# Patient Record
Sex: Female | Born: 1960 | Race: White | Hispanic: No | Smoking: Never smoker
Health system: Southern US, Community
[De-identification: ages and names within clinical notes are randomized; demographics above are authoritative.]

## PROBLEM LIST (undated history)

## (undated) DIAGNOSIS — L930 Discoid lupus erythematosus: Secondary | ICD-10-CM

## (undated) DIAGNOSIS — M8580 Other specified disorders of bone density and structure, unspecified site: Secondary | ICD-10-CM

## (undated) DIAGNOSIS — N200 Calculus of kidney: Secondary | ICD-10-CM

## (undated) DIAGNOSIS — M3214 Glomerular disease in systemic lupus erythematosus: Secondary | ICD-10-CM

## (undated) DIAGNOSIS — E785 Hyperlipidemia, unspecified: Secondary | ICD-10-CM

## (undated) HISTORY — DX: Hyperlipidemia, unspecified: E78.5

## (undated) HISTORY — DX: Discoid lupus erythematosus: L93.0

## (undated) HISTORY — PX: KNEE SURGERY: SHX244

## (undated) HISTORY — DX: Calculus of kidney: N20.0

## (undated) HISTORY — PX: LITHOTRIPSY: SUR834

## (undated) HISTORY — DX: Glomerular disease in systemic lupus erythematosus: M32.14

## (undated) HISTORY — DX: Other specified disorders of bone density and structure, unspecified site: M85.80

---

## 1999-03-21 ENCOUNTER — Other Ambulatory Visit: Admission: RE | Admit: 1999-03-21 | Discharge: 1999-03-21 | Payer: Self-pay | Admitting: Obstetrics and Gynecology

## 2000-04-19 ENCOUNTER — Other Ambulatory Visit: Admission: RE | Admit: 2000-04-19 | Discharge: 2000-04-19 | Payer: Self-pay | Admitting: Obstetrics and Gynecology

## 2001-05-16 ENCOUNTER — Other Ambulatory Visit: Admission: RE | Admit: 2001-05-16 | Discharge: 2001-05-16 | Payer: Self-pay | Admitting: Obstetrics and Gynecology

## 2002-06-05 ENCOUNTER — Other Ambulatory Visit: Admission: RE | Admit: 2002-06-05 | Discharge: 2002-06-05 | Payer: Self-pay | Admitting: Obstetrics and Gynecology

## 2002-11-06 ENCOUNTER — Encounter: Payer: Self-pay | Admitting: Urology

## 2002-11-06 ENCOUNTER — Encounter: Admission: RE | Admit: 2002-11-06 | Discharge: 2002-11-06 | Payer: Self-pay | Admitting: Urology

## 2003-07-13 ENCOUNTER — Other Ambulatory Visit: Admission: RE | Admit: 2003-07-13 | Discharge: 2003-07-13 | Payer: Self-pay | Admitting: Obstetrics and Gynecology

## 2004-05-20 ENCOUNTER — Encounter: Admission: RE | Admit: 2004-05-20 | Discharge: 2004-05-20 | Payer: Self-pay | Admitting: Nephrology

## 2004-07-15 ENCOUNTER — Other Ambulatory Visit: Admission: RE | Admit: 2004-07-15 | Discharge: 2004-07-15 | Payer: Self-pay | Admitting: Obstetrics and Gynecology

## 2005-08-25 ENCOUNTER — Other Ambulatory Visit: Admission: RE | Admit: 2005-08-25 | Discharge: 2005-08-25 | Payer: Self-pay | Admitting: Obstetrics and Gynecology

## 2007-02-21 ENCOUNTER — Ambulatory Visit (HOSPITAL_COMMUNITY): Admission: RE | Admit: 2007-02-21 | Discharge: 2007-02-21 | Payer: Self-pay | Admitting: Urology

## 2007-04-05 ENCOUNTER — Ambulatory Visit: Payer: Self-pay | Admitting: Family Medicine

## 2007-04-05 ENCOUNTER — Telehealth (INDEPENDENT_AMBULATORY_CARE_PROVIDER_SITE_OTHER): Payer: Self-pay | Admitting: *Deleted

## 2007-04-05 DIAGNOSIS — Z87442 Personal history of urinary calculi: Secondary | ICD-10-CM

## 2007-04-05 DIAGNOSIS — L930 Discoid lupus erythematosus: Secondary | ICD-10-CM

## 2007-04-05 DIAGNOSIS — K644 Residual hemorrhoidal skin tags: Secondary | ICD-10-CM | POA: Insufficient documentation

## 2007-04-05 LAB — CONVERTED CEMR LAB
Basophils Absolute: 0 10*3/uL (ref 0.0–0.1)
Basophils Relative: 0.3 % (ref 0.0–1.0)
Cholesterol: 190 mg/dL (ref 0–200)
Eosinophils Absolute: 0.1 10*3/uL (ref 0.0–0.6)
Eosinophils Relative: 1.9 % (ref 0.0–5.0)
Glucose, Bld: 78 mg/dL (ref 70–99)
HCT: 38 % (ref 36.0–46.0)
HDL: 49.1 mg/dL (ref >39.0)
Hemoglobin: 13 g/dL (ref 12.0–15.0)
LDL Cholesterol: 126 mg/dL — ABNORMAL HIGH (ref 0–99)
Lymphocytes Relative: 37.4 % (ref 12.0–46.0)
MCHC: 34.1 g/dL (ref 30.0–36.0)
MCV: 89.6 fL (ref 78.0–100.0)
Monocytes Absolute: 0.6 10*3/uL (ref 0.2–0.7)
Monocytes Relative: 20.1 % — ABNORMAL HIGH (ref 3.0–11.0)
Neutro Abs: 1.3 10*3/uL — ABNORMAL LOW (ref 1.4–7.7)
Neutrophils Relative %: 40.3 % — ABNORMAL LOW (ref 43.0–77.0)
Platelets: 267 10*3/uL (ref 150–400)
RBC: 4.24 M/uL (ref 3.87–5.11)
RDW: 12.9 % (ref 11.5–14.6)
TSH: 2.31 microintl units/mL (ref 0.35–5.50)
Total CHOL/HDL Ratio: 3.9
Triglycerides: 73 mg/dL (ref 0–149)
VLDL: 15 mg/dL (ref 0–40)
WBC: 3.2 10*3/uL — ABNORMAL LOW (ref 4.5–10.5)

## 2007-04-12 ENCOUNTER — Encounter (INDEPENDENT_AMBULATORY_CARE_PROVIDER_SITE_OTHER): Payer: Self-pay | Admitting: Family Medicine

## 2007-06-17 ENCOUNTER — Telehealth (INDEPENDENT_AMBULATORY_CARE_PROVIDER_SITE_OTHER): Payer: Self-pay | Admitting: *Deleted

## 2007-06-18 ENCOUNTER — Ambulatory Visit: Payer: Self-pay | Admitting: Family Medicine

## 2007-08-22 ENCOUNTER — Telehealth (INDEPENDENT_AMBULATORY_CARE_PROVIDER_SITE_OTHER): Payer: Self-pay | Admitting: *Deleted

## 2007-10-11 ENCOUNTER — Encounter (INDEPENDENT_AMBULATORY_CARE_PROVIDER_SITE_OTHER): Payer: Self-pay | Admitting: Nephrology

## 2007-10-11 ENCOUNTER — Ambulatory Visit (HOSPITAL_COMMUNITY): Admission: RE | Admit: 2007-10-11 | Discharge: 2007-10-12 | Payer: Self-pay | Admitting: Nephrology

## 2007-10-23 ENCOUNTER — Encounter (HOSPITAL_COMMUNITY): Admission: RE | Admit: 2007-10-23 | Discharge: 2007-11-21 | Payer: Self-pay | Admitting: Nephrology

## 2007-12-19 ENCOUNTER — Encounter (HOSPITAL_COMMUNITY): Admission: RE | Admit: 2007-12-19 | Discharge: 2008-03-18 | Payer: Self-pay | Admitting: Nephrology

## 2008-01-25 ENCOUNTER — Emergency Department (HOSPITAL_COMMUNITY): Admission: EM | Admit: 2008-01-25 | Discharge: 2008-01-25 | Payer: Self-pay | Admitting: Emergency Medicine

## 2008-02-02 ENCOUNTER — Inpatient Hospital Stay (HOSPITAL_COMMUNITY): Admission: EM | Admit: 2008-02-02 | Discharge: 2008-02-04 | Payer: Self-pay | Admitting: Emergency Medicine

## 2008-02-02 ENCOUNTER — Ambulatory Visit: Payer: Self-pay | Admitting: Internal Medicine

## 2008-03-02 ENCOUNTER — Encounter (HOSPITAL_COMMUNITY): Admission: RE | Admit: 2008-03-02 | Discharge: 2008-05-31 | Payer: Self-pay | Admitting: Nephrology

## 2008-07-16 ENCOUNTER — Encounter (INDEPENDENT_AMBULATORY_CARE_PROVIDER_SITE_OTHER): Payer: Self-pay | Admitting: Gastroenterology

## 2008-07-16 ENCOUNTER — Ambulatory Visit (HOSPITAL_COMMUNITY): Admission: RE | Admit: 2008-07-16 | Discharge: 2008-07-16 | Payer: Self-pay | Admitting: Gastroenterology

## 2009-08-31 IMAGING — CR DG ABDOMEN ACUTE W/ 1V CHEST
3 series · 3 of 3 positions shown · non-contrast
Comparison: 02/21/2007

CLINICAL DATA: Abdominal pain.  Nausea and vomiting.  On
chemotherapy.

ACUTE ABDOMEN SERIES (ABDOMEN 2 VIEW & CHEST 1 VIEW)

[w chest pa]
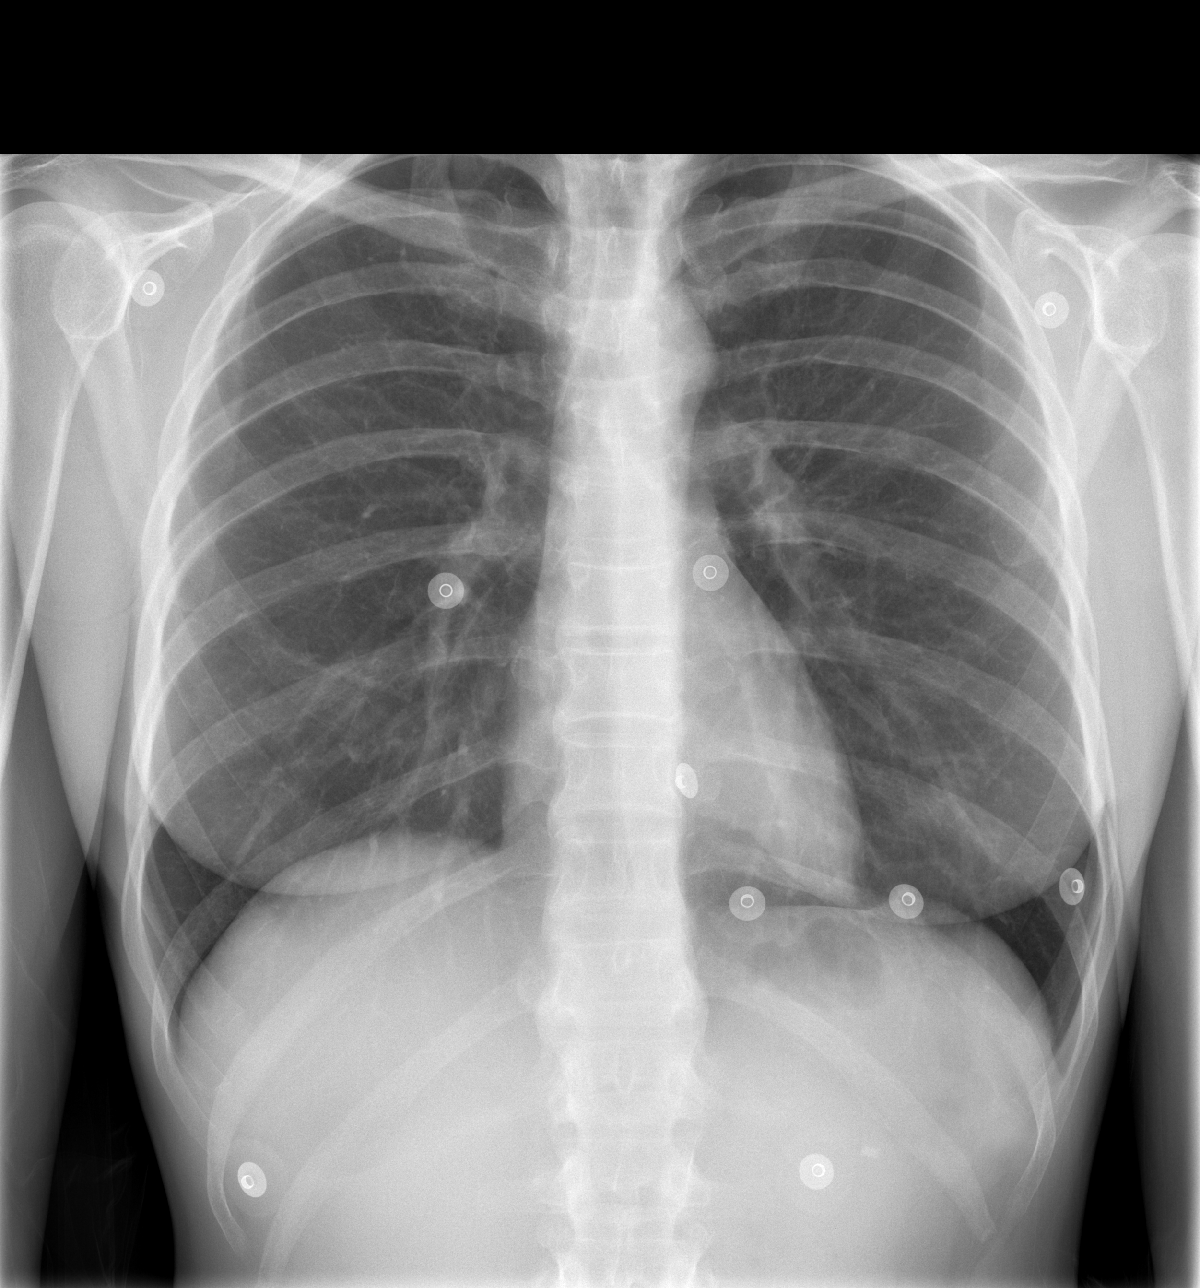

[w abdomen upright]
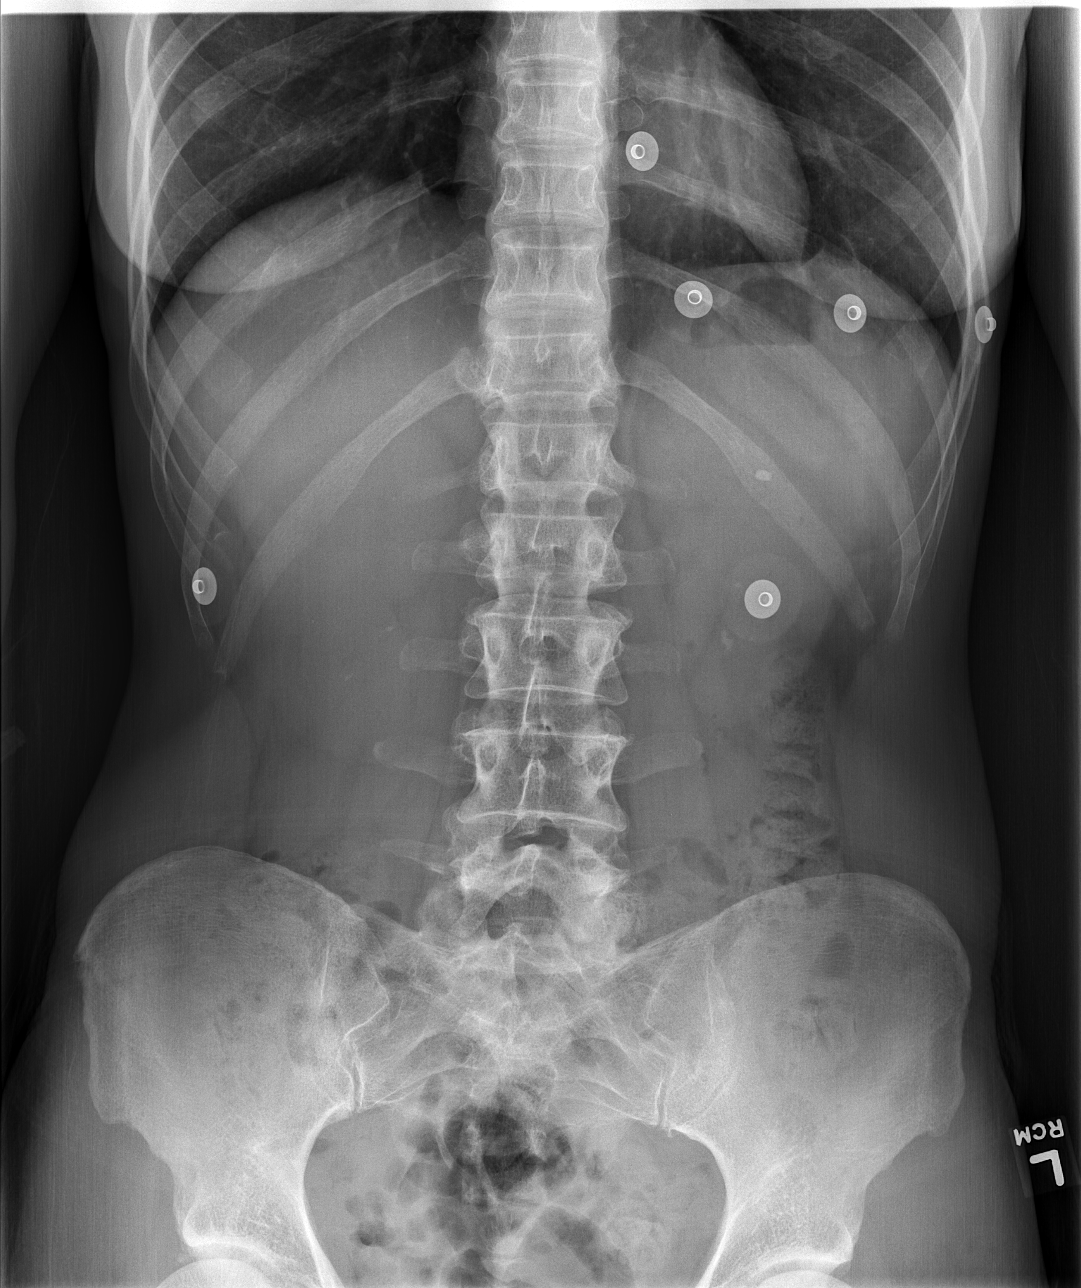

[t abdomen supine]
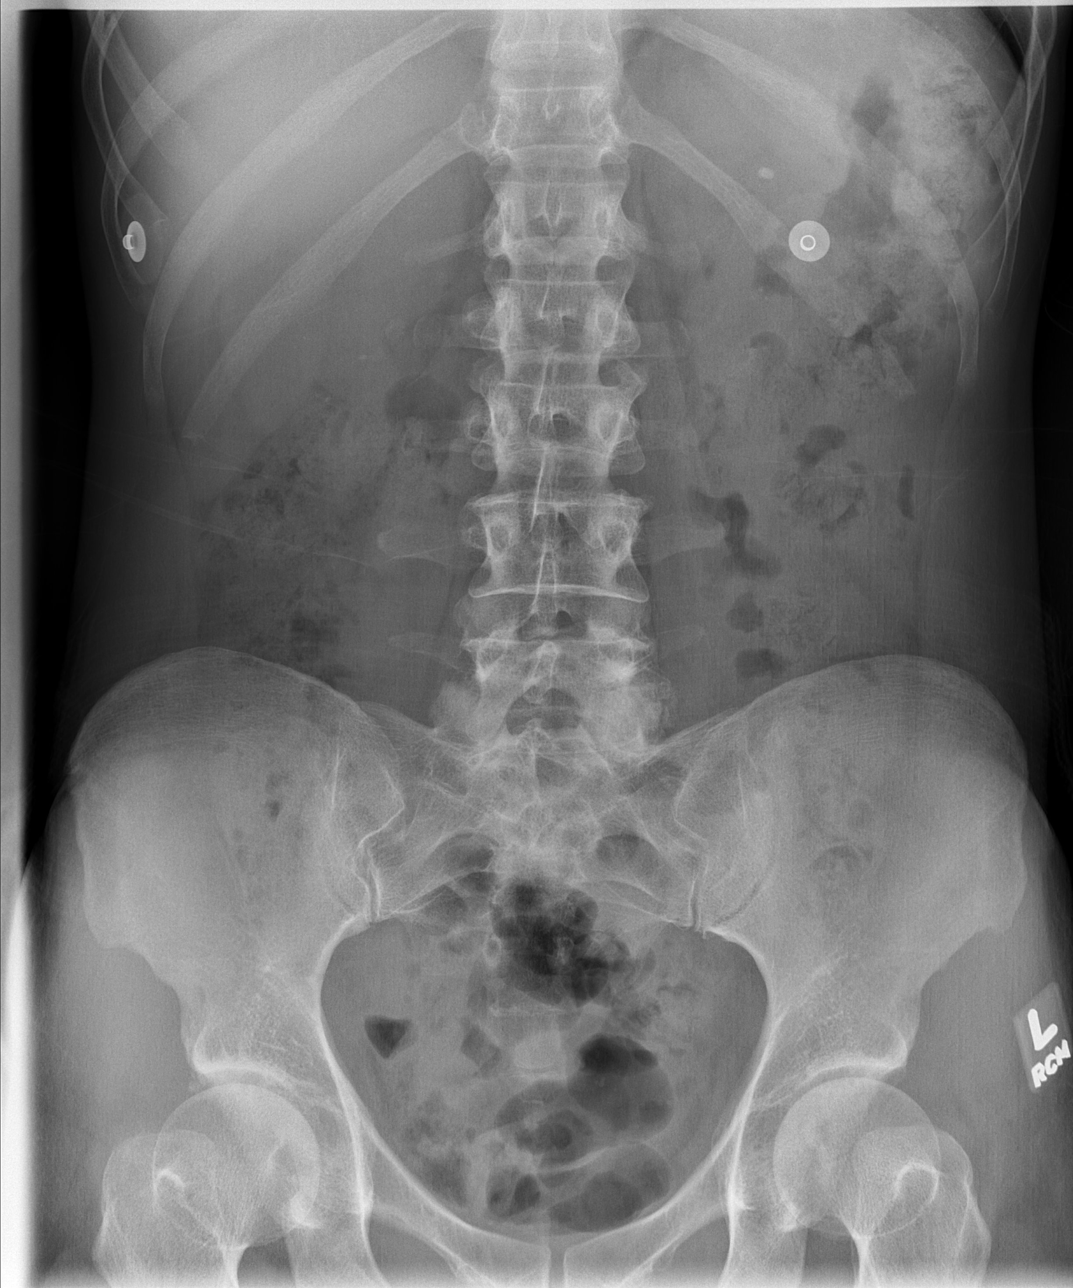

[3 of 3 positions shown; findings below may reference images not displayed]

FINDINGS: The bowel gas pattern is normal.  Small bilateral renal
calculi are again noted.  No definite calculi are seen along the
course the ureters.  There is no evidence of free air.

Heart size and mediastinal contours are normal.  Both lungs are
clear.
IMPRESSION: 1.  Bilateral nephrolithiasis.
2.  Normal bowel gas pattern.
3.  No active cardiopulmonary disease.

## 2009-09-07 IMAGING — CT CT HEAD W/O CM
1 of 2 series · 13 of 30 positions shown, 17 images · non-contrast
Comparison: None

CLINICAL DATA: [place] [facility]

CT HEAD WITHOUT CONTRAST
TECHNIQUE: Contiguous axial images were obtained from the base of
the skull through the vertex without contrast.

[Series 2: brain · axial · 0.47mm/px · z∈[+106,+236]mm · 13 of 32 slices shown, 17 images]
[im 3/32  brain]
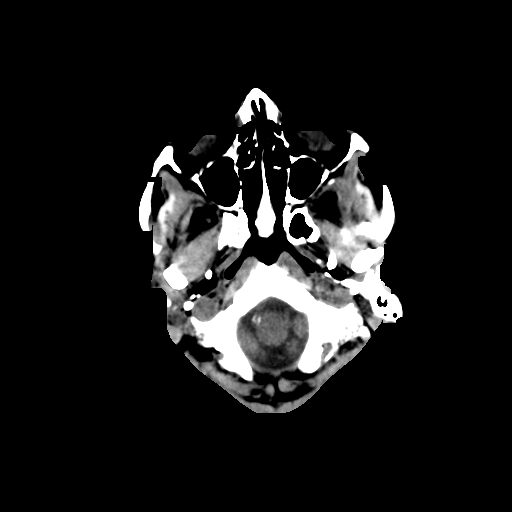
[im 3/32  bone]
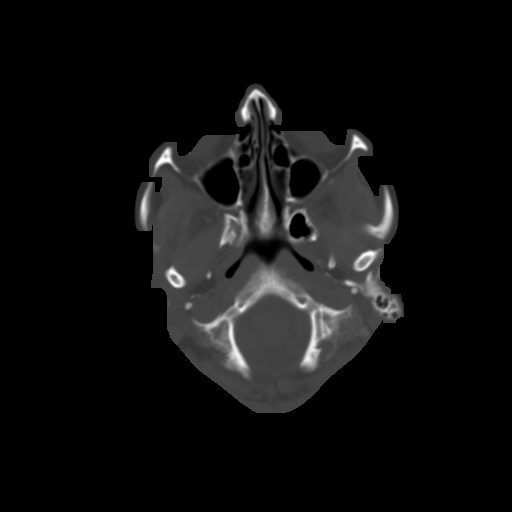
[im 5/32  brain]
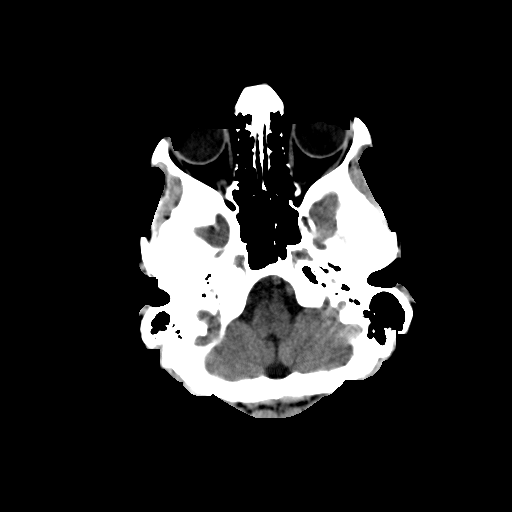
[im 7/32  brain]
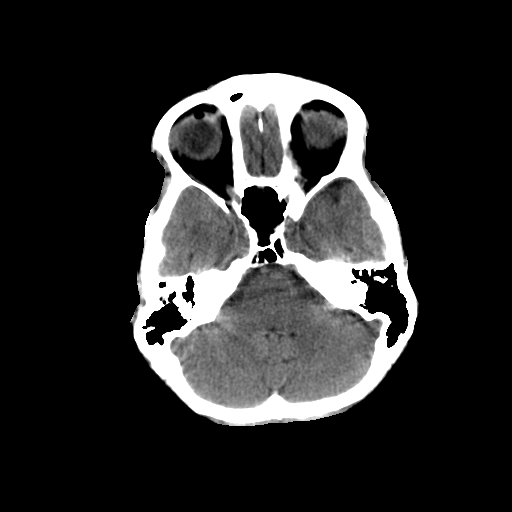
[im 9/32  brain]
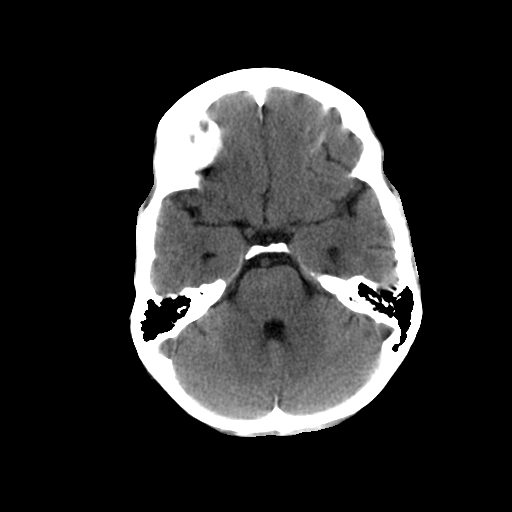
[im 12/32  brain]
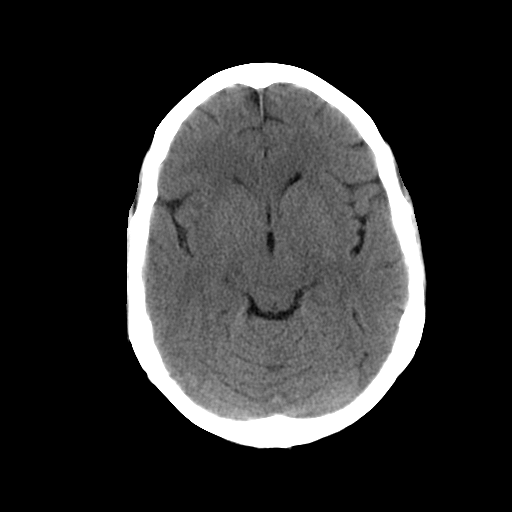
[im 12/32  bone]
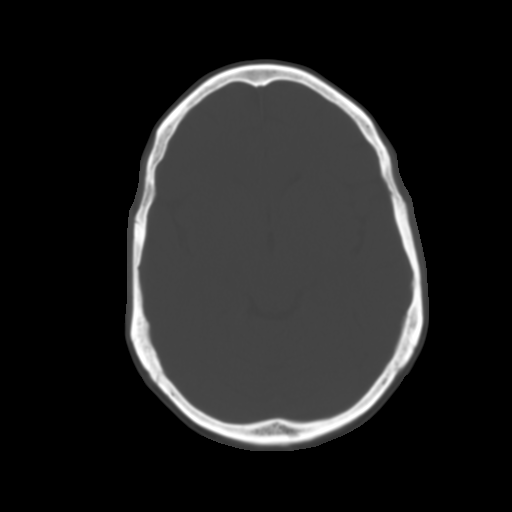
[im 14/32  brain]
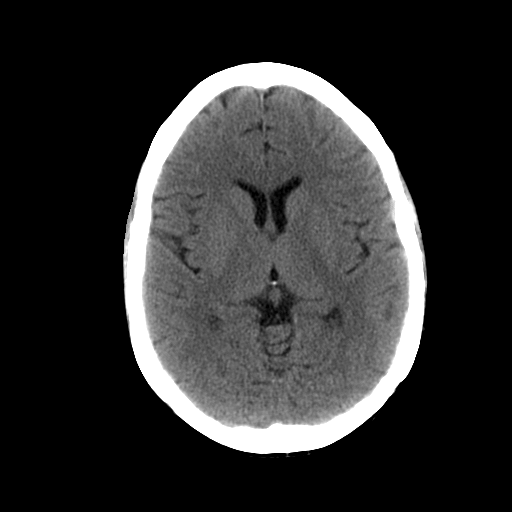
[im 16/32  brain]
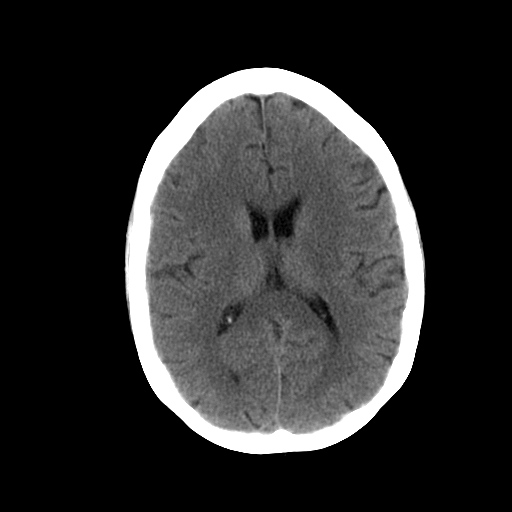
[im 18/32  brain]
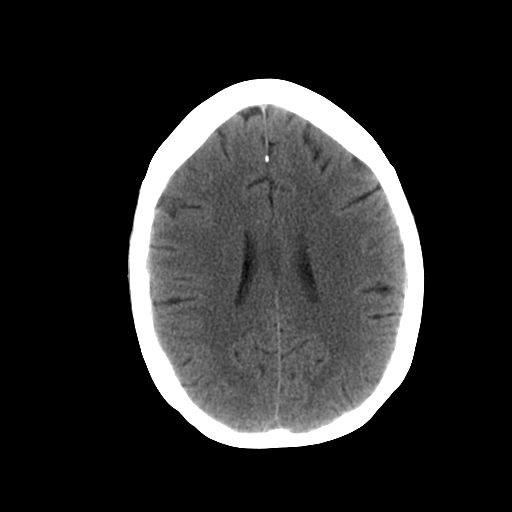
[im 20/32  brain]
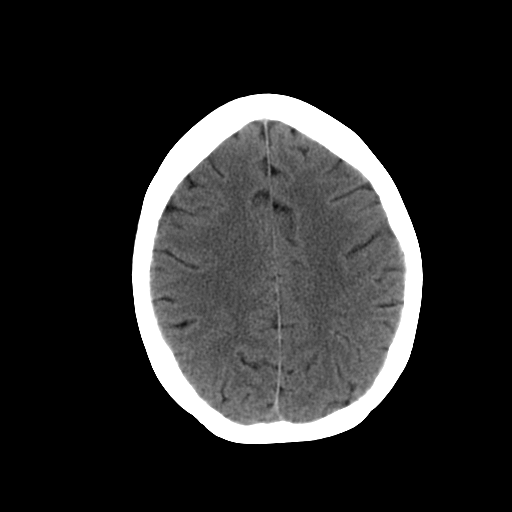
[im 20/32  bone]
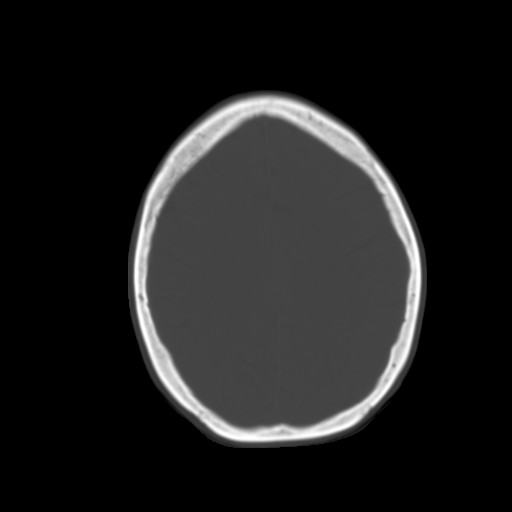
[im 23/32  brain]
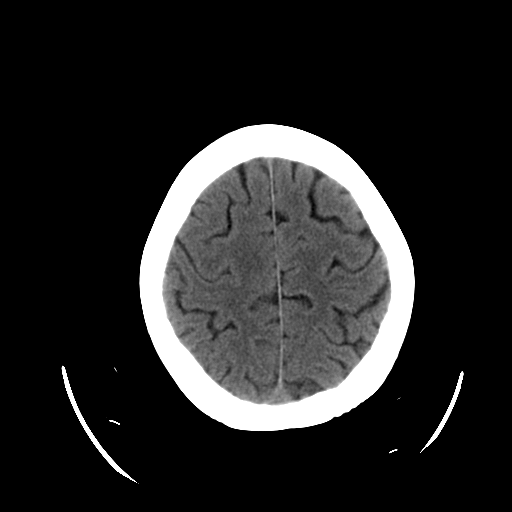
[im 25/32  brain]
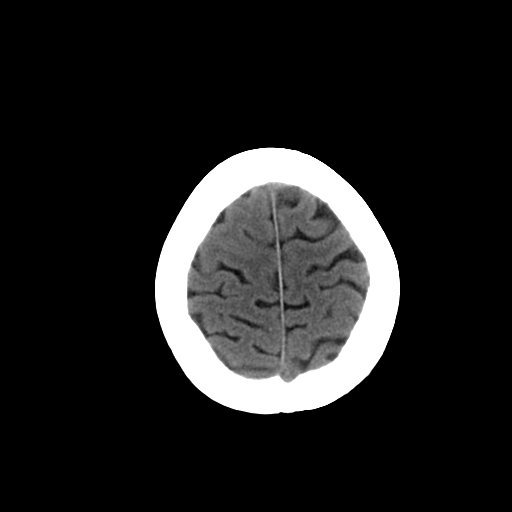
[im 27/32  brain]
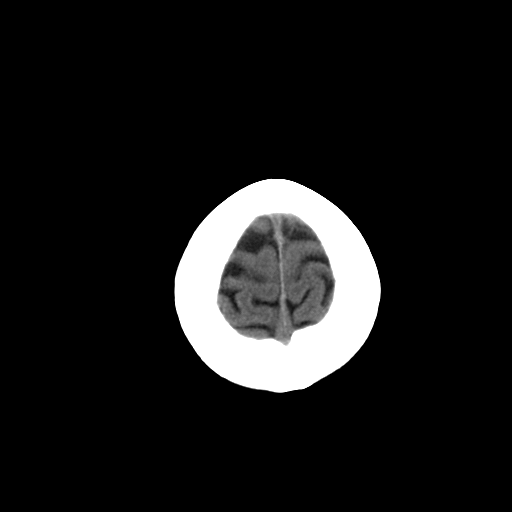
[im 29/32  brain]
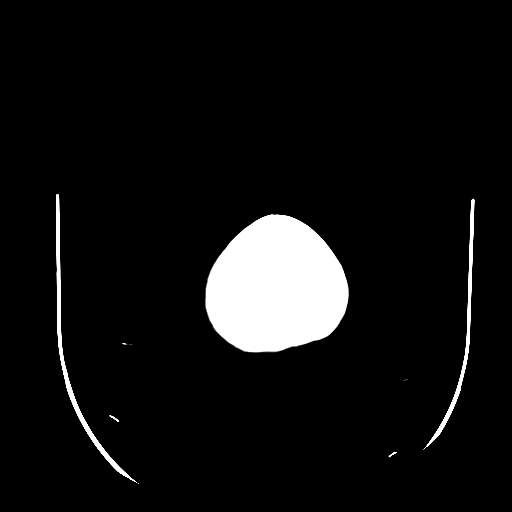
[im 29/32  bone]
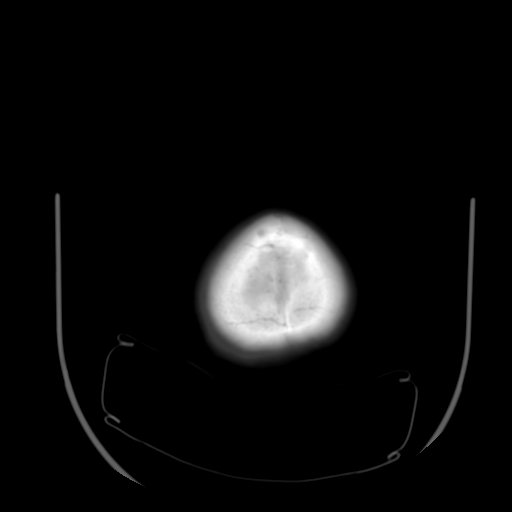

[13 of 30 positions shown; findings below may reference images not displayed]

FINDINGS: The brain has a normal appearance without evidence for
hemorrhage, infarction, hydrocephalus, or mass lesion.  There is no
extra axial fluid collection.  The skull and paranasal sinuses are
normal.
IMPRESSION: No acute intracranial abnormalities.

## 2010-01-28 ENCOUNTER — Ambulatory Visit (HOSPITAL_COMMUNITY): Admission: RE | Admit: 2010-01-28 | Discharge: 2010-01-28 | Payer: Self-pay | Admitting: Nephrology

## 2010-12-27 NOTE — H&P (Signed)
Dorothy Bray, BOWELL                ACCOUNT NO.:  [id number]   MEDICAL RECORD NO.:  [id number]          PATIENT TYPE:  INP   LOCATION:  3712                         FACILITY:  [facility]   PHYSICIAN:  Lucita Ferrara, MD         DATE OF BIRTH:  03-15-1961   DATE OF ADMISSION:  02/01/2008  DATE OF DISCHARGE:                              HISTORY & PHYSICAL   ADDENDUM TO HISTORY AND PHYSICAL EXAMINATION:  Note, consultants that have already been consulted include:  1. [facility]. Called and consult confirmed.  2. Psychiatry. Called and consult confirmed.  3. Dr. Arlean Hopping has been called from Nephrology services who is very      familiar with her case in regards to Cytoxan treatment. Called.      Lucita Ferrara, MD  Electronically Signed     RR/MEDQ  D:  02/02/2008  T:  02/02/2008  Job:  [id number]

## 2010-12-27 NOTE — Consult Note (Signed)
NAMEANEDRA, PENAFIEL                ACCOUNT NO.:  [id number]   MEDICAL RECORD NO.:  [id number]          PATIENT TYPE:  INP   LOCATION:  3712                         FACILITY:  [facility]   PHYSICIAN:  Antonietta Breach, M.D.  DATE OF BIRTH:  01-11-1961   DATE OF CONSULTATION:  02/03/2008  DATE OF DISCHARGE:                                 CONSULTATION   ADDENDUM:   REASON FOR CONSULTATION:  Psychosis.   LABORATORY DATA:  WBC 2.9, hemoglobin 10.2, platelet count 406,000,  sodium 136, BUN 31, creatinine 1.21.  Urine drug screen positive for  benzodiazepines.  TSH was elevated at 7.296.  The SGOT was 33, SGPT was  19.  B12 was decreased at 127.  Folate within normal limits.  RPR  nonreactive.  Head CT without contrast showed no acute abnormality.   REVIEW OF SYSTEMS:  Constitutional,  head, eyes, ears, nose and throat,  mouth, neurologic, psychiatric, cardiovascular, respiratory,  gastrointestinal, genitourinary, skin musculoskeletal, hematologic  lymphatic, endocrine metabolic all unremarkable.   EXAMINATION:  VITAL SIGNS:  Temperature 98.7, pulse 83, respiratory of  the scene blood pressure 114/75, O2 saturation on room air 97%  GENERAL APPEARANCE:  Mrs. Hainer is a middle-aged female sitting up in  her hospital bed with no abnormal involuntary movements.  MENTAL STATUS EXAM:  Mrs. Sotelo is alert.  Her eye contact is good.  Her attention span is slightly decreased.  Concentration is slightly  decreased.  Mood is mildly anxious.  Affect is mildly anxious.  Fund of  knowledge and intelligence are within normal limits.  Memory 3/3 objects  immediate, 3/3 objects at 5 minutes.  Speech involves normal rate and  prosody without dysarthria.  Thought process is logical, coherent, goal  directed.  No looseness of associations.  Thought content no thoughts of  harming herself.  No thoughts of harming others.  No delusions.  No  hallucinations.  Insight is good.  Judgment is intact.   ASSESSMENT:   AXIS I:  1. 293.00 Delirium not otherwise specified.  2. 293.84 Anxiety disorder not otherwise specified.  3. 293.83 Mood disorder not otherwise specified.  The the patient does      have a history of psychiatric symptom episodes that, according to      the patient's husband and the patient, have occurred only in      association with steroid treatment.  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  AXIS III:  See past medical history.  AXIS IV:  General medical.  AXIS V:  55 currently.  However, in the context of delirium, which can  wax and wane, the patient could still revert back into clouding of  consciousness.   The undersigned provided ego-supportive psychotherapy and education.   Findings from the literature were discussed regarding psychoses  associated with steroids.  It was explained to the patient and her  husband that the future possibility of a psychiatric syndrome occurring  with steroid therapy cannot be predicted based  on past episodes.   Therefore, the undersigned explained to them that if steroid therapy is  required, that they have a decision to make whether they want to start  preventive antipsychotic and/or mood stabilization therapy with  medication before the steroid treatment is started, as opposed to  waiting for psychiatric symptoms to emerge and then treating.   The indications, alternatives, and adverse effects of various acute  antianxiety agents were discussed including benzodiazepines and  hydroxyzine.  The patient wanted to use Benadryl due to her experience  of Benadryl helping her with anxiety.  She mentioned that, based on her  past experience with Benadryl helping her with feeling on edge, that she  could have the MRI with a small dose of Benadryl.   RECOMMENDATIONS:  1. Benadryl 25-50 mg p.o. IM/IV before the MRI and t.i.d. p.r.n.  2. At this time, since  the patient's symptoms have so greatly improved      and the patient and her husband request discontinuation, will      discontinue the orders for Abilify, Cogentin, Klonopin, Depakote      and Seroquel.  3. Please call psychiatry for any eruption of new psychiatric symptoms  4. The patient and her husband agree to call emergency services      immediately for any eruption of psychiatric symptoms.   DISCUSSION:  It is notable that the patient has a history of systemic  lupus erythematosus in the context of having an elevated TSH and  decreased B12.  To what degree these other factors are synergizing is  difficult to determine at this time.  Neurology is going to continue  their organic evaluation with the MRI.   The degree of the patient's dystonia, which included arching her back  last night, is unusual and it may be that she has a predisposition  towards benzodiazepine paradoxical disinhibition as well as a lower  threshold for dystonia.      Antonietta Breach, M.D.  Electronically Signed     JW/MEDQ  D:  02/03/2008  T:  02/03/2008  Job:  [id number]

## 2010-12-27 NOTE — Op Note (Signed)
NAMEOLINDA, NOLA                ACCOUNT NO.:  [id number]   MEDICAL RECORD NO.:  [id number]          PATIENT TYPE:  OIB   LOCATION:  6715                         FACILITY:  [facility]   PHYSICIAN:  Dyke Maes, M.D.DATE OF BIRTH:  12-10-1960   DATE OF PROCEDURE:  10/11/2007  DATE OF DISCHARGE:  10/12/2007                               OPERATIVE REPORT   TYPE OF PROCEDURE:  Left percutaneous renal biopsy.   REASON:  Nephrotic syndrome in a lupus patient.   Procedure explained to the patient and informed consent obtained.  The  patient was placed in a prone position and left kidney identified under  ultrasound.  The left flank was prepped and draped in a sterile fashion.  Local anesthesia was obtained with 10 mL of 1% Xylocaine.  Under direct  ultrasound guidance an 18-gauge biopsy needle was placed in the inferior  pole of the left kidney on two separate occasions with the return of two  cores of renal tissue.  The post-biopsy ultrasound showed a small  perirenal hematoma.  The patient tolerated the procedure well.           ______________________________  Dyke Maes, M.D.     MTM/MEDQ  D:  10/11/2007  T:  10/12/2007  Job:  [id number]

## 2010-12-27 NOTE — Consult Note (Signed)
NAMEJALEAH, Dorothy Bray                ACCOUNT NO.:  [id number]   MEDICAL RECORD NO.:  [id number]          PATIENT TYPE:  INP   LOCATION:  3712                         FACILITY:  [facility]   PHYSICIAN:  Anselm Jungling, MD  DATE OF BIRTH:  Nov 24, 1960   DATE OF CONSULTATION:  02/02/2008  DATE OF DISCHARGE:                                 CONSULTATION   IDENTIFYING DATA AND REASON FOR REFERRAL:  The patient is a 50 year old  Caucasian female currently under the care of the [facility] team here  at [facility].  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  In  addition, the patient has a history of manic episodes in the past.  Psychiatric consultation is requested for opinion regarding her  psychotropic medication management.   HISTORY OF PRESENTING PROBLEMS:  The patient has a history of lupus and  a history of episodes of CNS lupus.  She came to [facility]  approximately 8 days ago and was treated for dehydration.  At that time  she had been extremely anxious and had been unable to sleep.  She was  prescribed some Ativan.  Accompanying these symptoms were pressured  speech and decreased eating.  Ativan gave minimal relief from these and  these symptoms continued.   The patient has a history of manic episodes in the past that were  apparently associated with steroid administration.  However, currently  she has not been treated with any steroids.   CURRENT MEDICATIONS:  1. Have included Phenergan.  2. Lasix.  3. Plaquenil.  4. Amlodipine.  5. Metolazone.  6. Potassium chloride.   There has been consideration of restarting psychotropic medications and  also reconsideration of a course of steroids, which might address her  cerebritis versus vasculitis more expeditiously, but there has been  concern about this exacerbating her psychiatric symptoms.  Yesterday, in  the emergency room, the patient  apparently received some Haldol.   CT scan of the head is within normal limits at this time.   MENTAL STATUS AND OBSERVATIONS:  I came to visit the patient in her  hospital room this morning, February 02, 2008, at 11:45 a.m..  Her daughter  was at the bedside.  The patient was appearing to have an  opisthotonus/EPS reaction.  She was posturing with both of her forearms  extended, her neck hyperextended, her eyes gazing directly upward  towards the vertex of her head with mouth wide open.  She was breathing  normally but seemed unable to respond.  Her daughter indicated that she  had been this way for a little while.  I immediately ordered Benadryl 50  mg IM stat.  Because of this circumstance I was not able to conduct a  mental status examination.   IMPRESSION:  The patient has a history of manic episodes and it is not  clear whether she has an underlying bipolar disorder or whether these  episodes have historically been exclusively related to steroid  administration.  Her daughter tells me that currently she is not under a  psychiatrist's care and has not been taking any psychotropic medications  aside from the Ativan that was prescribed after her January 25, 2008,  emergency room visit.   It appears that she has been developing an episode of mood elevation  over the past 2 weeks characterized by increased anxiety, decreased  sleeping and eating and pressured speech.  At this moment in time she  appears to be having a severe EPS reaction to the Haldol that was given  yesterday.   DIAGNOSTIC IMPRESSION:  AXIS I:  Manic episode, most likely secondary to  medical condition, versus bipolar affective disorder, currently manic.  Extrapyramidal syndrome, NOS.  AXIS II:  Deferred.  AXIS III:  Systemic lupus erythematosus, with possible cerebritis versus  vasculitis.  AXIS IV:  Stressors severe.  AXIS V:  GAF 25.   RECOMMENDATIONS:  As above.  I have also ordered Cogentin 1 mg b.i.d.  Haldol  has been discontinued already.  I would recommend a trial of  Abilify 10 mg daily, which should have a minimal proclivity towards  producing EPS, and in addition, initiated Depakote ER 500 mg b.i.d. to  help stabilize mood.   With regard to the question of whether or not steroids should be  administered at this time given the risk of exacerbation of psychiatric  symptoms, my judgment would be that her medical condition needs to be  stabilized, as if there is an underlying vasculitis or cerebritis  secondary to her lupus, she is not likely to improve psychiatrically  until that condition is addressed.  It is possible the administration of  steroids may exacerbate some of her psychiatric symptoms, but these  should probably be manageable in this setting with judicious and close  medication monitoring.   I will ask Dr. Jeanie Sewer to follow up with the patient tomorrow morning,  Monday, June 22.   In the meantime, contact me if there are any further complications at  [phone].      Anselm Jungling, MD  Electronically Signed     SPB/MEDQ  D:  02/02/2008  T:  02/02/2008  Job:  [id number]

## 2010-12-27 NOTE — H&P (Signed)
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  She apparently had a  similar episode about 10 years ago when she was first diagnosed with  lupus.  She was apparently empirically treated with prednisone which  apparently made her symptoms worse.  She is currently receiving IV  Cytoxan once monthly for the last 4 months for lupus nephritis.  Last  treatment was last week by Dr. Briant Cedar from Nephrology services.  She  denies any fevers, chills, neck pain, photophobia.  She denies any chest  pain, shortness of breath, rashes. No drugs or alcohal and no known or  diagnosed psychiatric illness.   PAST SURGICAL HISTORY:  1. Status post tubal ligation.  2. Status post orthopedic procedures.   SOCIAL HISTORY:  She lives with her husband.  She denies drugs, alcohol  or tobacco.   ALLERGIES:  1. PREDNISONE  2. ADVAIR.  3. QUINACRINE  4. PRINIVIL.  5. SULFA  6. CIPRO.   MEDICATIONS:  1. Phenergan.  2. Lasix.  3. Plaquenil.  4. Amlodipine.  5. Metolazone  6. Potassium chloride.Marland Kitchen   PHYSICAL EXAMINATION:  GENERAL:  The patient is a thin, very anxious,  Caucasian female.  She has a flight of ideas and racing thoughts, very  distractible, seems very agitated.  VITAL SIGNS:  Blood pressure is 134/93, pulse 74, respirations 18,  temperature 97.7.  HEENT:   Normocephalic, atraumatic.  Sclerae anicteric.  NECK:  Supple.  No JVD, no carotid bruits.  PERLA.  Extraocular muscles  intact.  CARDIOVASCULAR:  S1-S2, regular rate and rhythm.  No murmurs, rubs or  clicks.  ABDOMEN:  Soft, nontender, nondistended.  Positive bowel sounds.  LUNGS:  Clear to auscultation bilaterally.  No rhonchi, rales or  wheezes.  EXTREMITIES:  No clubbing, cyanosis or edema.  NEUROLOGICAL:  Patient is alert, oriented x3.  Follows commands.  Cranial nerves II-XII grossly intact.  She has no sensory deficits.  Strength 5/5 bilaterally upper and lower extremities.  Cerebellar and  gait cannot be assessed secondary to the patient's non-willing to  perform the test.  She has no clonus.   LABORATORY DATA:  Urinalysis shows greater than 300 protein, nephrotic  range proteinuria.  ESR is 123.  Her I-STAT shows a hemoglobin 9.2,  hematocrit 27, sodium 131, potassium 4.4.  CBC shows white count 2.5,  hemoglobin 9.8, hematocrit 28.3, platelets 391, absolute neutrophil  count is 1.8.   ASSESSMENT/PLAN:  A 51 year old with altered mental status, pressured  speech, increased ESR to 123 with systemic lupus erythematosus, and  lupus nephritis.   PLAN:  1. 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  The patient would likely benefit from high dose Solu-      Medrol, Solu-Cortef, but given her non-tolorance to it in th past      and the fact that it may make her psychiatric symptoms worse, we      will hold off for now.  Regardless, she adamantly refuses it due to      a psychotic episode that she had 10 years ago.  Neurology was      already consulted in the emergency room and their recommendation      was to proceed with MRI with Gadolinium, and if abnormal, ALP, but       treatment probably will be immune modulation per Neurology.      Note, her Cytoxan currently used for lupus nephritis also indicated      for lupus cerebritis, but dose may need  to be adjusted.  We would      appreciated nephrology versus neurology recommendations in this      regard.  2. Leukopenia and anemia.  Likely resulting from Cytoxan and active      chemo modulation.  Her absolute neutrophil count is 1.8.  Will      monitor the ANC Neupogen p.r.n.  If needed, will get      Hematology/Oncology consultation.  Note, if she becomes      neutropenic, her differential diagnosis, including CNS infections,      will widened.  I do not see any indication for empiric antibiotics      at this point.  3. Mania.  Likely from lupus.  Will also need to get Psychiatry      recommendation in regards to medications.  I will go ahead and      initiate clonazepam, as there is some evidence that this helps in      bipolar mania. Haldol prn and seroquel standing dose, until psych      has evaluated.  Will also need Neurology input on consideration for      Depakote.  4. DVT and GI prophylaxis.  5. The rest of the plans are dependent on her progress and on      consultant recommendations.      Lucita Ferrara, MD  Electronically Signed     RR/MEDQ  D:  02/02/2008  T:  02/02/2008  Job:  [id number]

## 2010-12-27 NOTE — Consult Note (Signed)
NAMECHERILYN, Dorothy Bray                ACCOUNT NO.:  [id number]   MEDICAL RECORD NO.:  [id number]          PATIENT TYPE:  INP   LOCATION:  3712                         FACILITY:  [facility]   PHYSICIAN:  Maree Krabbe, M.D.DATE OF BIRTH:  [birth year]   DATE OF CONSULTATION:  02/02/2008  DATE OF DISCHARGE:                                 CONSULTATION   PRIMARY CARE PHYSICIAN:  Dr. Lonia Blood, [facility].   REASON FOR CONSULT:  Acute psychosis in a patient with lupus nephritis,  followed by Dr. Briant Cedar.   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  She  presented initially with lower extremity edema and according to the  family, this has been her only lupus manifestation.  The husband gives  the history, and he denied any history of rash or other organ  involvement or joint involvement.  According to the husband, the patient  had been doing well, but several months ago had a flare with increasing  proteinuria and edema.  Proteinuria was up to around 10 g for 24 hours I  believe.  She was tried on CellCept without response and then switched  to the course of monthly IV Cytoxan, of which she has had about 4 doses.  The proteinuria has improved and she has also been brought to the high  doses of diuretics to control her edema.   Last week, the patient was in the emergency room with dehydration and  mild-to-moderate anxiety and was discharged after IV fluids, on a p.r.n.  Xanax, and Ambien at night.  She has a history of chronic anxiety, but  has never required psychiatric assistance.  Last night, she presented  with psychotic behavior, not sleeping, and severe anxiety for about 2  weeks.  Not eating, fidgety.  Denied any headache or focal neurologic  complaints.  No witness seizures.  Head CT in the emergency room was  normal.  Creatinine was 1.2, hemoglobin 9.2, and white blood count 2.5.  Urinalysis greater than 300 protein, 7-10 red cells, and 0-2 white blood  cells.   The patient was admitted through the night, got dose of Haldol.  This  morning, she is not verbally responsive, but she is riding in bed with  chorio-acidotic movements of her upper extremities, dystonic movements  of her tongue, hyperextension of the neck, and just completely not  responding verbally.   PAST MEDICAL HISTORY:  Lupus membranous nephritis and chronic anxiety.   MEDICATIONS ON ADMISSION:  1. She is taking,  2. Lasix 160 b.i.d.  3. Amiloride 10 once a day.  4. Potassium 100 mEq a day.  5. Plaquenil 200 b.i.d.  6. Metolazone 1/3 of a 5 mg tab daily.  7. Phenergan as needed.  8. Aspirin one a day.  9. Xanax as needed.  She has been receiving her monthly Cytoxan and we      need to get outpatient records and date of her last dose.   ALLERGIES:  She apparently had psychotic reaction to STEROIDS one time  in the past about 10 years ago.   SOCIAL HISTORY:  Lives with her husband.  She has a son who is setting  for the [employer]'s, and a daughter who is a Designer, jewellery.  No alcohol,  tobacco, or drug use.   REVIEW OF SYSTEMS:  Not currently available.   PHYSICAL EXAMINATION:  VITAL SIGNS:  Blood pressure is 129/70, pulse 90,  respirations 20, and temperature 98.3.  SKIN:  Warm and dry.  HEENT:  PERRLA, EOMI.  Throat is dry.  NECK:  Supple and flat neck veins.  CHEST:  Clear throughout.  CARDIAC:  Regular rate and rhythm without murmur, rub, or gallop.  ABDOMEN:  Soft and nontender.  Active bowel sounds.  EXTREMITIES:  No edema.  NEUROLOGIC:  Dystonic movements of the mouth, neck, tongue, and upper  extremities.   LABS:  BUN 25, creatinine 1.2, potassium 4.5, hemoglobin 9.2, and white  blood count 2.5.  Urinalysis and head CT as above.   IMPRESSION:  1. Lupus class V membranous glomerulonephritis, of long-standing      duration,  with recent flare.  The patient was treated with       CellCept and failed and now is status post 4 doses of IV Cytoxan.      She is also on Plaquenil.  2. Altered mental status with psychotic behavior.  Underlying      significant anxiety history.  The patient may be also having a      reaction to HALDOL with dystonic movements.  She also appears quite      manic.  3. Leukopenia, possibly due to Cytoxan or lupus.  4. History of steroid-related psychosis.   RECOMMENDATIONS:  1. We will get outpatient records in the morning.  2. Would recommend IV fluids if the patient continues not to eat.  3. We will add anti-DNA antibody to lupus activity markers and defer      workup of mental status changes to primary service.      Maree Krabbe, M.D.  Electronically Signed     RDS/MEDQ  D:  02/02/2008  T:  02/03/2008  Job:  [id number]

## 2010-12-27 NOTE — Op Note (Signed)
[name], Dorothy Bray                ACCOUNT NO.:  [id number]   MEDICAL RECORD NO.:  [id number]          PATIENT TYPE:  AMB   LOCATION:  ENDO                         FACILITY:  [facility]   PHYSICIAN:  Petra Kuba, M.D.    DATE OF BIRTH:  [birth year]   DATE OF PROCEDURE:  07/16/2008  DATE OF DISCHARGE:                               OPERATIVE REPORT   PROCEDURE:  Colonoscopy with biopsy.   INDICATION:  Bright red blood per rectum with some change in bowel  habits, almost due for colonic screening.  Consent was signed after  risks, benefits, methods, and options were thoroughly discussed in the  office and before any sedation.   MEDICINES USED FOR THIS PROCEDURE:  1. Fentanyl 75 mcg.  2. Phenergan 25 mg.  3. Versed 1 mg.   PROCEDURE:  Rectal inspection is pertinent for external hemorrhoids,  small.  Digital exam was negative.  The video pediatric colonoscope was  inserted with abdominal pressure, apparently easily advanced around the  colon to the cecum.  No abnormalities or signs of bleeding were seen on  insertion.  Cecum was identified by the appendiceal orifice and the  ileocecal valve.  Next the scope was inserted slowly into the terminal  ileum, which was normal.  Further documentation was obtained.  The scope  was slowly withdrawn.  Along the ileocecal valve was a tiny polyp, which  was cold biopsied x2 and the scope was further withdrawn.  No other  abnormalities were seen as we slowly withdrew back to the rectum.  Specifically, no other polyps, tumors, masses, signs of bleeding,  diverticula, etc.  Once back in the rectum, anorectal pull-through and  retroflexion confirmed some small hemorrhoids.  Scope was drained,  readvanced towards the left side of the colon.  Air was suctioned and  scope removed.  The patient tolerated the procedure well.  There was no  obvious immediate complication.   ENDOSCOPIC DIAGNOSES:  1. Internal and external small hemorrhoids.  2. Polyp, status  post biopsy.  3. Otherwise within normal limits to the terminal ileum.   PLAN:  Await pathology, but if adenomatous, probably repeat colonoscopy  in 5 years.  Continue workup with an EGD, treat hemorrhoids.  We will  try some __________ with 2.5% HC cream for now.           ______________________________  Petra Kuba, M.D.     MEM/MEDQ  D:  07/16/2008  T:  07/17/2008  Job:  [id number]   cc:   Dyke Maes, M.D.  Richard M. Marcelle Overlie, M.D.  Areatha Keas, M.D.

## 2010-12-27 NOTE — Consult Note (Signed)
NAMECOSIMA, PRENTISS                ACCOUNT NO.:  [id number]   MEDICAL RECORD NO.:  [id number]          PATIENT TYPE:  INP   LOCATION:  3712                         FACILITY:  [facility]   PHYSICIAN:  Antonietta Breach, M.D.  DATE OF BIRTH:  1961-05-30   DATE OF CONSULTATION:  02/03/2008  DATE OF DISCHARGE:                                 CONSULTATION   REQUESTING PHYSICIAN:  Dr. Birdie Sons   REASON FOR CONSULTATION:  Manic and delirium symptoms.   HISTORY OF PRESENT ILLNESS:  Mrs. Wampole is a 50 year old female  admitted to the [facility] on February 01, 2008, due to dehydration in  the context of lupus erythematosus.  The patient did have some Xanax  given in the emergency room.  Over the past 5 days the patient erupted  into high-energy pressured speech, decreased sleep, severe anxiety,  insomnia, and developed a dystonic reaction to Haldol.   The patient was given Benadryl for the dystonic reaction and the  Benadryl was successful.  The patient received orders for a number of  psychotropic medications including Depakote.  However, she did not  receive any new psychotropic medications after the Haldol and Benadryl  except for Cogentin 1 mg this morning.   The patient's husband asked the nurses not to give her any more  medication until he and the patient had a chance to review the history  with a doctor.   This morning, the patient does continue with feeling on edge.  However,  her memory function which was disturbed has now returned.  Also, her  orientation has returned to normal.  She is socially appropriate and  cooperative.  She is not having any hallucinations or delusions.   PAST PSYCHIATRIC HISTORY:  The patient requested that her husband be in  the room to provide additional history and received additional  education.   Both the patient and the husband confirm that the only episodes  involving manic symptoms such as high energy, decreased sleep, pressured  speech have  been when the patient is taking steroid therapy for her  systemic lupus erythematosus.   She underwent an admission for manic symptoms associated with steroid  therapy at [facility] psychiatric unit approximately 10  years ago.   The patient has been on Cytoxan successfully for several years without  any mental status aberration.  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   The patient has not been prescribed preventive psychotropic medications.  The organic evaluation including an LP back in the late 1990s combined  with a psychiatric evaluation concluded that the patient did not need  any maintenance psychotropic medications.   FAMILY PSYCHIATRIC HISTORY:  None known.   SOCIAL HISTORY:  The patient is married.  Her husband is supportive and  at the bedside.  Religion:  [employer].  Currently disabled.   She does not do any alcohol or illegal drugs.   PAST MEDICAL HISTORY:  Systemic lupus erythematosus, rule out systemic  lupus erythematosus __________ versus vasculitis.  ALLERGIES:  1. SULFA.  2. CIPROFLOXACIN.  3. LISINOPRIL.   Prednisone has been associated with manic and severe anxiety symptoms.   MEDICATIONS:  The MAR is reviewed.  Please see the discussion above in  the history of present illness.      Antonietta Breach, M.D.  Electronically Signed     JW/MEDQ  D:  02/03/2008  T:  02/03/2008  Job:  [id number]

## 2010-12-27 NOTE — Consult Note (Signed)
NAME:  Dorothy Bray, Dorothy Bray                ACCOUNT NO.:  [id number]   MEDICAL RECORD NO.:  [id number]          PATIENT TYPE:  INP   LOCATION:  1828                         FACILITY:  [facility]   PHYSICIAN:  Pramod P. Pearlean Brownie, MD    DATE OF BIRTH:  Jul 26, 1961   DATE OF CONSULTATION:  02/01/2008  DATE OF DISCHARGE:                                 CONSULTATION   REASON FOR CONSULTATION:  Consult requested by Ocie Cornfield. Upstill, PA in  the ER.   REASON FOR THE CONSULT:  Lupus with mental status changes.   Consultation is being done for [facility] at the request of ER.   Dorothy Bray is a 50 year old Caucasian female with a history of lupus  nephritis who has been brought in by the family for mental status  changes.  Apparently, for 2 weeks she has been having significant  anxiety with decreased sleep, pressured speech and not eating well.  She  tried Ambien which did not help and she has recently been somewhat  maintained on Xanax, but this evening her symptoms got significantly  worse that she was brought in the ER.  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  She was initially on Plaquenil and CellCept, but  recently for the last 4 months being receiving Cytoxan, the fourth dose  was given last week.  She is still continuing on Plaquenil.  The fourth  dose was given as apparently her inflammatory markers for lupus have not  gotten down even with treatment.  There is also a history of some  similar manic psychiatric episode multiple years ago, but that was  thought to be secondary to prednisone.   Currently, limited history could be obtained from the family as the  patient was very agitated in bed and any amount of trying to communicate  with the patient was making her worse, but as per the family they deny  any neurological problems like headaches.  They think her memory and  cognition are intact except for the manic  episodes.  There have been no  hallucinations, but has been a little delusional.  She denies any  headaches, dizziness, paresthesias, or weakness.  Denies any recent  falls.  Denies any fevers.  As mentioned above, she has not been  sleeping well at all and has decreased appetite and not eating well.   PAST MEDICAL HISTORY:  Lupus with renal insufficiency.   PAST SURGICAL HISTORY:  None significant.   SOCIAL HISTORY:  No history of alcohol or drug use or smoking.   FAMILY HISTORY:  None significant.   REVIEW OF SYSTEMS:  The pertinent positives are mentioned in the history  and physical.   ALLERGIES:  ATIVAN, CIPRO, PREDNISONE, PRINIVIL, QUINAPRIL, and SULFA.   MEDICATIONS:  She is currently on Plaquenil and Cytoxan.  She is also on  amlodipine, potassium chloride, metolazone, Lasix, and Phenergan. She  received Geodon in the ER   PHYSICAL EXAMINATION:  VITAL SIGNS:  Blood pressure is 134/93, pulse  rate 74, temperature was  97.7.  She was saturating 100% on room.  GENERAL:  On examination, the patient is lying in the bed, but is very  fidgety with ringing of her hands, frequent clapping, and moving her  hands up and down.  She is consistently talking and saying I have a  plan and we are going to stick to the plan.  HEENT:  Normocephalic and atraumatic.  NECK:  Supple.  No carotid bruits.  CARDIOVASCULAR:  Regular rhythmic rate.  LUNGS:  Clear to air entry.  EXTREMITIES:  No cyanosis, clubbing, or edema.  NEUROLOGIC:  The patient is awake and alert.  She is oriented to person  and place.  She is oriented to time saying tomorrow is Father's Day, but  does not tell me the exact time or date.  She does note that a few days  ago was a January 29, 2008, which was a Wednesday as it was her mother's  birthday.  She is identifying her family members appropriately and is  following commands appropriately.  A detailed cognitive exam could not  be performed as any questions were aggravating  her and making her more  restless.   Pupils are symmetric and reactive to light and accommodation.  Extraocular movements were intact.  Visual fields were intact to threat.  No facial weakness or asymmetry.  Tongue was midline without any atrophy  or weakness.  Palate elevation was symmetric.  Motor evaluation normal  bulk and tone.  Strength appear intact, as she does not have focal  weakness, but detailed muscular testing could not be performed as the  patient was noncooperative.  Sensory evaluation was intact to vibration  and pain.  Deep tendon reflexes were brisk with positive bilateral  Hoffman's.  No ankle clonus.  Plantars were downgoing.  Cerebellar  evaluation could not be performed again due to limited patient  cooperation.   Her CT of the head was reviewed.  There are no focal abnormalities.  Her  labs were reviewed.  CBC shows a white blood cell count of 2.5,  hemoglobin 9.8, hematocrit 28.3, platelets 391.  Sodium 131, potassium  4.5, chloride 105, BUN 25, creatinine 1.2.  ESR is 123 but we do not  have a prior ESR to compare.  UA showed more than 3 and a total protein  with negative ketones, negative nitrates, and negative leukocyte  esterase.   IMPRESSION:  History of lupus nephritis with mental status changes.  Currently, her exam is suggestive of manic or  psychotic episode.  There  are no focal neurological deficits to suggest,  focal cerebritis, but  the patient is at risk due to her baseline lupus and also is at risk of  opportunistic infections due to her Cytoxan treatment though she has not  had a fever or white count or any other changes that could be associated  with this.  So, to rule out any structural intracranial abnormalities,  we recommend MRI of the brain with contrast .  After the MRI, further  evaluation will be recommended.  If the MRI is abnormal, then we will  consider doing a lumbar puncture to assess central nervous system  inflammatory  processes.  Also, trying to evaluate if her lupus is  flaring might be helpful as lupus flares are associated with more  neurological problems whereas  mental status changes without a flare or  abnormal disease markers  may be secondary to psychiatric problems.   At this time, the history is not suggestive of  seizures.   I have explained to the family that we will look at the MRI and reassess  the patient in the morning after she gets some amount of sleep.  At that  time, if there are any abnormalities, then further recommendations will  be made.   [facility] will be following in the morning.      Darnelle Bos, MD  Electronically Signed     ______________________________  Sunny Schlein. Pearlean Brownie, MD    RB/MEDQ  D:  02/01/2008  T:  02/02/2008  Job:  [id number]

## 2010-12-27 NOTE — Discharge Summary (Signed)
Dorothy Bray, Dorothy Bray                ACCOUNT NO.:  [id number]   MEDICAL RECORD NO.:  [id number]          PATIENT TYPE:  INP   LOCATION:  3712                         FACILITY:  [facility]   PHYSICIAN:  Rosalyn Gess. Norins, MD  DATE OF BIRTH:  [birth year]   DATE OF ADMISSION:  02/01/2008  DATE OF DISCHARGE:  02/04/2008                               DISCHARGE SUMMARY   DISCHARGE DIAGNOSES:  1. Altered mental status with acute psychotic episode.  2. B12 deficiency, newly diagnosed.  3. Hypothyroid, newly diagnosed.  4. History of lupus.   HISTORY OF PRESENT ILLNESS:  Ms.  Bray is a 50 year old female  admitted February 01, 2008, who presented with agitation, pressured speech  anxiety and inability to sleep.  She apparently had similar episodes 10  years prior to this at which time she was first diagnosed with lupus.  She was admitted for further evaluation and treatment.   PAST MEDICAL HISTORY:  1. Lupus with multisystem involvement.  2. Chronic renal insufficiency.   COURSE OF HOSPITALIZATION:  1. Altered mental status.  The patient was admitted.  A Neurology      consult was requested and the patient was seen by Dr. Delia Heady.      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  Of note,      the patient refused steroid treatment due to history of psychosis      with steroids in the past.  She did have severe extrapyramidal      symptoms secondary to Haldol which was given in the emergency      department in the setting of mania.  The patient was seen initially      by Dr. Electa Sniff of Psychiatry and Benadryl was given as well as      Cogentin.  The patient was ordered for Depakote ER.  The patient's      symptoms did improve.  The patient and husband did refuse multiple      medications during this admission.  An MRI of the brain was      performed during this admission following a normal head CT      performed on February 01, 2008.  MRI noted mild atherosclerosis of the      distal  right vertebral artery with no acute intracranial      abnormality.  She was seen in follow up by Dr. Jeanie Sewer during      this admission who recommended no psychotropics and that benzos      should be used as a last resort for antianxiety/agitation.  At this      time, the patient's husband wished the patient to be discharged      home.  They wished to the primary care [facility] outside of the      [facility].  The plan to follow up with Dr.      Briant Cedar as scheduled.  Neurology feels a CNS etiology unlikely      due to lupus cerebritis.  2. B12 deficiency.  The patient was given a dose of IMB12 during  this      admission.  Will need to be continued on monthly IM injection as an      outpatient.  3. Hypothyroid.  The patient was noted to have an elevated TSH and was      started on Synthroid during this admission which should be      continued.  She will need outpatient follow up thyroid function      tests in 4-6 weeks.   DISPOSITION:  The patient will be discharged to home.   FOLLOW UP:  She is to follow up with primary care physician of her  choice and follow up with Dr. Briant Cedar as scheduled.   MEDICATIONS AT TIME OF DISCHARGE:  1. Levothyroxine 50 mcg p.o. daily.  2. B12 injection once monthly.  3. Lasix 160 mg p.o. b.i.d.  4. Amiloride 10 mg p.o. daily.  5. Potassium chloride, the patient is to continue same dose as before.  6. Plaquenil 200 mg p.o. b.i.d.  7. Phenergan 25 mg p.o. q.4 h. as needed.  8. Aspirin 325 mg p.o. daily.  9. Xanax  0.25 mg 1-2 tablets p.o. twice daily as needed.   PERTINENT LABORATORY DATA:  At time of discharge, TSH 7.296, B12 122,  folate 17.4, hemoglobin 10.2, hematocrit 20.9, BUN 31, creatinine 1.2.  Greater than 30 minutes were spent on discharge planning.      Sandford Craze, NP      Rosalyn Gess. Norins, MD  Electronically Signed    MO/MEDQ  D:  02/04/2008  T:  02/05/2008  Job:  [id number]   cc:   Dyke Maes, M.D.

## 2010-12-27 NOTE — Consult Note (Signed)
Dorothy Bray, Dorothy Bray                ACCOUNT NO.:  [id number]   MEDICAL RECORD NO.:  [id number]          PATIENT TYPE:  INP   LOCATION:  3712                         FACILITY:  [facility]   PHYSICIAN:  Antonietta Breach, M.D.  DATE OF BIRTH:  1961-01-18   DATE OF CONSULTATION:  DATE OF DISCHARGE:  02/04/2008                                 CONSULTATION   HISTORY OF PRESENT ILLNESS:  Dorothy Bray did successfully get her brain  MRI and MRA completed using Benadryl for her anxiety.  She did not have  any adverse side effects with the Benadryl.   The patient's orientation has remained intact.  Her memory function has  remained intact.  She has been cooperative.  She has not had any  hallucinations or delusions.  She has continued to maintain normal  interests constructive future goals and hope.   REVIEW OF SYSTEMS:  Neurologic:  No further dystonias or abnormal  movements.   LABORATORY DATA:  Sodium 136, BUN 31, creatinine 1.21, glucose 103.  Compliment 3 was decreased just slightly at 72, complement 4 was within  normal limits.  The brain MRI with and without contrast was  unremarkable.  The MRA without contrast showed a possible mild  atherosclerosis, but otherwise unremarkable.   PHYSICAL EXAMINATION:  VITAL SIGNS:  Temperature 97.6, pulse 99,  respiratory rate 18, blood pressure 114/86, O2 saturation on room air  95%.   MENTAL STATUS EXAM:  Dorothy Bray is alert.  Her attention span is normal.  She is oriented to all spheres.  Her fund of knowledge and intelligence  are within normal limits.  Her affect is mildly anxious mood is mildly  anxious.  Her eye contact is good.  Speech involves normal rate and  prosody without dysarthria.  Volume is normal.  Thought process is  logical, coherent, goal-directed.  No looseness of associations.  Thought content, no thoughts of harming herself, no thoughts of harming  others.  No delusions, no hallucinations.  Insight is intact judgment is   intact.   ASSESSMENT:  (293.83)  Mood disorder not otherwise specified.  (293.84)  Anxiety disorder, not otherwise specified.  (293.00)  Delirium not otherwise specified.  The patient's mental status  abnormalities have resolved.  She is psychiatrically cleared for  discharge.  She agrees to call emergency services immediately for return  of any psychiatric symptoms.   The undersigned provided further education regarding the patient's  future psychotropic regimens in the event that she requires going on  steroid therapy.   Benzodiazepines should be used as a last resort for anxiety or  agitation, and if needed, Valium intravenously could be given in  sufficient amount to provide sedation without paradoxical disinhibition.   Otherwise, Depakote could be administered if the patient is having  severe agitation without psychosis.   If the patient ever experiences hallucinations or delusions, then she is  to complete typical antipsychotic would be preferable to a conventional  antipsychotic and prophylactic Benadryl should be administered.   Total time for follow-up was 25 minutes.      Antonietta Breach, M.D.  Electronically  Signed     JW/MEDQ  D:  02/04/2008  T:  02/04/2008  Job:  [id number]

## 2010-12-27 NOTE — Op Note (Signed)
Dorothy Bray, Dorothy Bray                ACCOUNT NO.:  [id number]   MEDICAL RECORD NO.:  [id number]          PATIENT TYPE:  AMB   LOCATION:  ENDO                         FACILITY:  [facility]   PHYSICIAN:  Petra Kuba, M.D.    DATE OF BIRTH:  05-Sep-1960   DATE OF PROCEDURE:  07/16/2008  DATE OF DISCHARGE:                               OPERATIVE REPORT   PROCEDURE:  Esophagogastroduodenoscopy.   INDICATION:  Some upper tract symptoms and episodic dysphagia.  The  patient on immunosuppressive, want to rule out structural versus  infectious etiologies.  Consent was signed after risks, benefits,  methods, options, thoroughly discussed multiple times in the past.   ADDITIONAL MEDICATIONS FOR THIS PROCEDURE:  1. Fentanyl 25 mcg.  2. Versed 1 mg.   DESCRIPTION OF PROCEDURE:  The videoendoscope was inserted by direct  vision.  The esophagus was normal.  No signs of infectious etiologies.  There was tiny hiatal hernia.  Scope passed into the stomach and  advanced through normal antrum, normal pylorus into the normal duodenal  bulb and around the C-loop to the normal second portion of the duodenum.  Scope was slowly withdrawn back to the bulb and a good look there ruled  out abnormalities in all location.  Scope was withdrawn back to the  stomach and retroflexed.  The hiatal hernia was confirmed in the cardia.  The fundus, angularis, and lesser and greater curves were all normal.  Retroflexion and straight visualization of the stomach did not reveal  any additional findings.  Air was suctioned.  The scope was slowly  withdrawn.  Again, a good look at the esophagus was normal.  A quick  look at the vocal cords was normal.  The scope was removed.  The patient  tolerated the procedure well.  There was no obvious immediate  complication.   ENDOSCOPIC DIAGNOSES:  1. Tiny hiatal hernia.  2. Otherwise, within normal esophagogastroduodenoscopy.   PLAN:  Pump inhibitors p.r.n. that seems to be helping.   Call me p.r.n.  and otherwise follow up in 2 months.  Please see colonoscopy for other  recommendation, workup, plans, etc.           ______________________________  Petra Kuba, M.D.     MEM/MEDQ  D:  07/16/2008  T:  07/17/2008  Job:  [id number]   cc:   Dyke Maes, M.D.

## 2011-05-05 LAB — CROSSMATCH
ABO/RH(D): A POS
Antibody Screen: POSITIVE
DAT, IgG: NEGATIVE
Donor AG Type: NEGATIVE
Donor AG Type: NEGATIVE

## 2011-05-05 LAB — CBC
HCT: 29.3 — ABNORMAL LOW
HCT: 30.2 — ABNORMAL LOW
Hemoglobin: 10.3 — ABNORMAL LOW
Hemoglobin: 9.9 — ABNORMAL LOW
MCHC: 33.7
MCHC: 34
MCV: 88.5
MCV: 89.5
Platelets: 318
Platelets: 321
RBC: 3.31 — ABNORMAL LOW
RBC: 3.37 — ABNORMAL LOW
RDW: 13.6
RDW: 13.7
WBC: 3.7 — ABNORMAL LOW
WBC: 4.5

## 2011-05-08 LAB — CBC
HCT: 30.4 — ABNORMAL LOW
HCT: 30.5 — ABNORMAL LOW
Hemoglobin: 10.6 — ABNORMAL LOW
Hemoglobin: 10.6 — ABNORMAL LOW
MCHC: 34.7
MCHC: 34.9
MCV: 87.5
MCV: 87.8
Platelets: 351
Platelets: 372
RBC: 3.47 — ABNORMAL LOW
RBC: 3.48 — ABNORMAL LOW
RDW: 13.1
RDW: 13.4
WBC: 2.3 — ABNORMAL LOW
WBC: 2.9 — ABNORMAL LOW

## 2011-05-08 LAB — DIFFERENTIAL
Basophils Absolute: 0
Basophils Absolute: 0
Basophils Relative: 1
Basophils Relative: 1
Eosinophils Absolute: 0
Eosinophils Absolute: 0.1
Eosinophils Relative: 1
Eosinophils Relative: 2
Lymphocytes Relative: 23
Lymphocytes Relative: 26
Lymphs Abs: 0.6 — ABNORMAL LOW
Lymphs Abs: 0.7
Monocytes Absolute: 0.1
Monocytes Absolute: 0.4
Monocytes Relative: 17 — ABNORMAL HIGH
Monocytes Relative: 5
Neutro Abs: 1.2 — ABNORMAL LOW
Neutro Abs: 2
Neutrophils Relative %: 55
Neutrophils Relative %: 70

## 2011-05-11 LAB — RAPID URINE DRUG SCREEN, HOSP PERFORMED
Amphetamines: NOT DETECTED
Cocaine: NOT DETECTED
Opiates: NOT DETECTED
Tetrahydrocannabinol: NOT DETECTED

## 2011-05-11 LAB — URINE CULTURE
Colony Count: NO GROWTH
Culture: NO GROWTH

## 2011-05-11 LAB — COMPREHENSIVE METABOLIC PANEL
ALT: 19
AST: 33
Albumin: 1.1 — ABNORMAL LOW
Alkaline Phosphatase: 48
BUN: 38 — ABNORMAL HIGH
CO2: 29
Calcium: 8 — ABNORMAL LOW
Chloride: 91 — ABNORMAL LOW
Creatinine, Ser: 1.05
GFR calc Af Amer: >60
GFR calc non Af Amer: 56 — ABNORMAL LOW
Glucose, Bld: 104 — ABNORMAL HIGH
Potassium: 2.9 — ABNORMAL LOW
Sodium: 129 — ABNORMAL LOW
Total Bilirubin: 0.8
Total Protein: 4.6 — ABNORMAL LOW

## 2011-05-11 LAB — BASIC METABOLIC PANEL
BUN: 41 — ABNORMAL HIGH
BUN: 31 — ABNORMAL HIGH
CO2: 29
CO2: 18 — ABNORMAL LOW
Calcium: 8 — ABNORMAL LOW
Calcium: 8.3 — ABNORMAL LOW
Chloride: 91 — ABNORMAL LOW
Creatinine, Ser: 0.98
Creatinine, Ser: 1.21 — ABNORMAL HIGH
GFR calc Af Amer: >60
GFR calc non Af Amer: >60
GFR calc non Af Amer: 48 — ABNORMAL LOW
Glucose, Bld: 77
Glucose, Bld: 103 — ABNORMAL HIGH
Potassium: 2.9 — ABNORMAL LOW
Sodium: 130 — ABNORMAL LOW
Sodium: 136

## 2011-05-11 LAB — URINE MICROSCOPIC-ADD ON

## 2011-05-11 LAB — CBC
HCT: 32.9 — ABNORMAL LOW
HCT: 28.3 — ABNORMAL LOW
Hemoglobin: 11.9 — ABNORMAL LOW
Hemoglobin: 10.2 — ABNORMAL LOW
Hemoglobin: 9.8 — ABNORMAL LOW
MCHC: 36.1 — ABNORMAL HIGH
MCHC: 34.6
MCHC: 35.5
MCV: 87.9
MCV: 89.2
Platelets: 420 — ABNORMAL HIGH
Platelets: 406 — ABNORMAL HIGH
RBC: 3.74 — ABNORMAL LOW
RBC: 3.17 — ABNORMAL LOW
RDW: 13.4
RDW: 13.5
WBC: 4.4

## 2011-05-11 LAB — TSH: TSH: 7.296 — ABNORMAL HIGH

## 2011-05-11 LAB — URINALYSIS, ROUTINE W REFLEX MICROSCOPIC
Bilirubin Urine: NEGATIVE
Bilirubin Urine: NEGATIVE
Glucose, UA: NEGATIVE
Glucose, UA: NEGATIVE
Ketones, ur: NEGATIVE
Leukocytes, UA: NEGATIVE
Nitrite: NEGATIVE
Protein, ur: >300 — AB
Protein, ur: >300 — AB
Specific Gravity, Urine: 1.023
Urobilinogen, UA: 0.2
Urobilinogen, UA: 0.2
pH: 7

## 2011-05-11 LAB — DIFFERENTIAL
Basophils Relative: 0
Basophils Relative: 1
Eosinophils Absolute: 0
Eosinophils Relative: 0
Eosinophils Relative: 0
Lymphocytes Relative: 3 — ABNORMAL LOW
Monocytes Absolute: 0.2
Monocytes Relative: 13 — ABNORMAL HIGH
Monocytes Relative: 7
Neutrophils Relative %: 84 — ABNORMAL HIGH

## 2011-05-11 LAB — ANTI-NUCLEAR AB-TITER (ANA TITER): ANA Titer 1: 1:40 {titer} — ABNORMAL HIGH

## 2011-05-11 LAB — PHOSPHORUS: Phosphorus: 5.9 — ABNORMAL HIGH

## 2011-05-11 LAB — CK TOTAL AND CKMB (NOT AT ARMC): CK, MB: 5.2 — ABNORMAL HIGH

## 2011-05-11 LAB — PROTIME-INR
INR: 0.9
Prothrombin Time: 12.3

## 2011-05-11 LAB — APTT: aPTT: 25

## 2011-05-11 LAB — POCT I-STAT, CHEM 8
BUN: 25 — ABNORMAL HIGH
Creatinine, Ser: 1.2
Glucose, Bld: 84
Hemoglobin: 9.2 — ABNORMAL LOW
TCO2: 22

## 2011-05-11 LAB — ANTI-DNA ANTIBODY, DOUBLE-STRANDED: ds DNA Ab: 3 IU/mL (ref <5)

## 2011-05-11 LAB — AMMONIA: Ammonia: 30

## 2011-05-11 LAB — TROPONIN I: Troponin I: 0.03

## 2011-05-11 LAB — RPR: RPR Ser Ql: NONREACTIVE

## 2011-05-11 LAB — C4 COMPLEMENT: Complement C4, Body Fluid: 25

## 2011-05-12 LAB — POCT I-STAT 4, (NA,K, GLUC, HGB,HCT)
Glucose, Bld: 82
Hemoglobin: 8.8 — ABNORMAL LOW
Potassium: 3.3 — ABNORMAL LOW
Sodium: 130 — ABNORMAL LOW

## 2011-05-15 LAB — POCT HEMOGLOBIN-HEMACUE: Hemoglobin: 11.6 — ABNORMAL LOW

## 2011-05-17 LAB — POCT HEMOGLOBIN-HEMACUE: Hemoglobin: 10.2 — ABNORMAL LOW

## 2011-09-07 ENCOUNTER — Other Ambulatory Visit: Payer: Self-pay | Admitting: Dermatology

## 2014-10-29 ENCOUNTER — Other Ambulatory Visit: Payer: Self-pay | Admitting: Dermatology

## 2014-11-04 ENCOUNTER — Other Ambulatory Visit: Payer: Self-pay | Admitting: Obstetrics and Gynecology

## 2014-11-05 LAB — CYTOLOGY - PAP

## 2019-03-27 ENCOUNTER — Ambulatory Visit: Payer: BC Managed Care – PPO | Admitting: Cardiology

## 2019-03-27 ENCOUNTER — Telehealth: Payer: Self-pay | Admitting: *Deleted

## 2019-03-27 ENCOUNTER — Encounter: Payer: Self-pay | Admitting: Cardiology

## 2019-03-27 ENCOUNTER — Other Ambulatory Visit: Payer: Self-pay

## 2019-03-27 ENCOUNTER — Telehealth: Payer: Self-pay | Admitting: Cardiovascular Disease

## 2019-03-27 VITALS — BP 116/84 | HR 67 | Temp 97.7°F | Ht 65.0 in | Wt 118.0 lb

## 2019-03-27 DIAGNOSIS — Z8249 Family history of ischemic heart disease and other diseases of the circulatory system: Secondary | ICD-10-CM

## 2019-03-27 DIAGNOSIS — R000 Tachycardia, unspecified: Secondary | ICD-10-CM

## 2019-03-27 DIAGNOSIS — M329 Systemic lupus erythematosus, unspecified: Secondary | ICD-10-CM

## 2019-03-27 DIAGNOSIS — R0789 Other chest pain: Secondary | ICD-10-CM | POA: Insufficient documentation

## 2019-03-27 DIAGNOSIS — R9431 Abnormal electrocardiogram [ECG] [EKG]: Secondary | ICD-10-CM | POA: Diagnosis not present

## 2019-03-27 DIAGNOSIS — R002 Palpitations: Secondary | ICD-10-CM

## 2019-03-27 DIAGNOSIS — R079 Chest pain, unspecified: Secondary | ICD-10-CM

## 2019-03-27 DIAGNOSIS — IMO0002 Reserved for concepts with insufficient information to code with codable children: Secondary | ICD-10-CM

## 2019-03-27 NOTE — Assessment & Plan Note (Signed)
She has lupus nephritis, followed at [facility].  She tells me her renal function in normal (labs pending).  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  She does not see a rheumatologist.

## 2019-03-27 NOTE — Assessment & Plan Note (Addendum)
Pt seen today with complaints of intermittent "hard beats" for the past few weeks- new for her.  Labs, including TSH, drawn yesterday by her PCP- results pending

## 2019-03-27 NOTE — Telephone Encounter (Signed)
7 day ZIO XT long term holter monitor to be mailed to patients home.  Instructions reviewed briefly as they are included in the monitor kit. 

## 2019-03-27 NOTE — Telephone Encounter (Signed)
New message:   Patient returning call back concering her address is [address]

## 2019-03-27 NOTE — Patient Instructions (Addendum)
Medication Instructions:  STOP Aspirin  If you need a refill on your cardiac medications before your next appointment, please call your pharmacy.   Lab work: None  If you have labs (blood work) drawn today and your tests are completely normal, you will receive your results only by: Marland Kitchen MyChart Message (if you have MyChart) OR . A paper copy in the mail If you have any lab test that is abnormal or we need to change your treatment, we will call you to review the results.  Testing/Procedures: Your physician has requested that you have an echocardiogram. Echocardiography is a painless test that uses sound waves to create images of your heart. It provides your doctor with information about the size and shape of your heart and how well your heart's chambers and valves are working. This procedure takes approximately one hour. There are no restrictions for this procedure. [facility] has requested that you have a lexiscan myoview. For further information please visit [email or web address]. Please follow instruction sheet, as given. NO MEDICATION TO HOLD  Your physician has recommended that you wear a 7 DAY ZIO-PATCH monitor. The Zio patch cardiac monitor continuously records heart rhythm data for up to 14 days, this is for patients being evaluated for multiple types heart rhythms. For the first 24 hours post application, please avoid getting the Zio monitor wet in the shower or by excessive sweating during exercise. After that, feel free to carry on with regular activities. Keep soaps and lotions away from the ZIO XT Patch.  This will be placed at our [facility] location - [address].        Follow-Up: At [facility], you and your health needs are our priority.  As part of our continuing mission to provide you with exceptional heart care, we have created designated Provider Care Teams.  These Care Teams include your primary Cardiologist (physician) and  Advanced Practice Providers (APPs -  Physician Assistants and Nurse Practitioners) who all work together to provide you with the care you need, when you need it.  . Your physician recommends that you schedule a follow-up appointment in: DR O'NEAL IN 4-6 WEEKS   Any Other Special Instructions Will Be Listed Below (If Applicable).

## 2019-03-27 NOTE — Assessment & Plan Note (Addendum)
Not clearly typical for angina but with FM Hx and abnormal EKG wil r/o CAD with Lexiscan and check echo.

## 2019-03-27 NOTE — Assessment & Plan Note (Signed)
EKG shows PVCs and TWI AVF, V4-V6

## 2019-03-27 NOTE — Telephone Encounter (Signed)
Returned patients call.  Address spelling corrected in demographics.

## 2019-03-27 NOTE — Progress Notes (Signed)
Cardiology Office Note:    Date:  03/27/2019   ID:  Dorothy Bray, DOB [birth year], MRN [id number]  PCP:  Berkley Harvey, NP  Cardiologist:  Dr Audie Box (new patient) Electrophysiologist:  None   Referring MD: Berkley Harvey, NP   CC: palpitations  History of Present Illness:    Dorothy Bray is a pleasant 58 y.o. female with a hx of lupus nephritis, followed at [facility].  She also has a family history of lupus. She tells me her sister died in her 20s of a heart attack which they attributed to chronic steroid use.  The patient herself has been intolerant of steroids in the past, these caused mania.  She is currently maintained on hydroxychloroquine and says she is doing well, she does not see a rheumatologist.   The patient was referred to [place] today for palpitations and an abnormal EKG.  She was seen by her PCP at [facility] yesterday.  She complained of intermittent "hard beats".  She denies any sustained tachycardia.  To me she also complained of intermittent chest tightness although she tells me she is able to exercise.  She does exercise daily doing an elliptical, yoga, and stationary bike.  Her palpitations and chest discomfort have not stopped her from exercising.  The patient's EKG was abnormal with PVCs and T wave inversion in aVF and V4 through V6.  Past Medical History:  Diagnosis Date  . Hyperlipidemia   . Lupus erythematosus   . Lupus nephritis ([place])    Followed at [facility]  . Nephrolithiasis   . Osteopenia     The histories are not reviewed yet. Please review them in the "History" navigator section and refresh this Dike.  Current Medications: Current Meds  Medication Sig  . aspirin EC 81 MG tablet Take 81 mg by mouth daily.  . Calcium-Vitamin D 500-125 MG-UNIT TABS Take by mouth.  Marland Kitchen DHEA 25 MG CAPS Take by mouth.  . diphenhydrAMINE (BENADRYL) 25 MG tablet Take by mouth.  . Estradiol 10 MCG TABS vaginal tablet Take by mouth.  .  hydroxychloroquine (PLAQUENIL) 200 MG tablet Take by mouth.  . Magnesium 400 MG CAPS Take by mouth.  . Multiple Vitamins-Minerals (VITAMIN D3 COMPLETE PO) Take 100 mcg by mouth.  . triamcinolone (KENALOG) 0.1 % paste Use as directed  . [DISCONTINUED] aspirin EC 81 MG tablet Take by mouth.     Allergies:   Benzodiazepines, Ciprofloxacin, Haloperidol lactate, Lisinopril, Alprazolam er, Fluticasone-salmeterol, Prednisone, and Sulfa antibiotics   Social History   Socioeconomic History  . Marital status: Divorced    Spouse name: Not on file  . Number of children: 0  . Years of education: Not on file  . Highest education level: Not on file  Occupational History  . Not on file  Social Needs  . Financial resource strain: Not on file  . Food insecurity    Worry: Not on file    Inability: Not on file  . Transportation needs    Medical: Not on file    Non-medical: Not on file  Tobacco Use  . Smoking status: Never Smoker  . Smokeless tobacco: Never Used  Substance and Sexual Activity  . Alcohol use: Not Currently  . Drug use: Never  . Sexual activity: Yes  Lifestyle  . Physical activity    Days per week: Not on file    Minutes per session: Not on file  . Stress: Not on file  Relationships  . Social  connections    Talks on phone: Not on file    Gets together: Not on file    Attends religious service: Not on file    Active member of club or organization: Not on file    Attends meetings of clubs or organizations: Not on file    Relationship status: Not on file  Other Topics Concern  . Not on file  Social History Narrative  . Not on file     Family History: The patient's family history includes Anemia in her mother; Cancer in her brother; Dementia in her father; Heart disease in her sister; Hyperlipidemia in her mother; Parkinsonism in her father.  ROS:   Please see the history of present illness.     All other systems reviewed and are negative.  EKGs/Labs/Other Studies  Reviewed:     EKG:  EKG is ordered today.  The ekg ordered today demonstrates NSR, PVCs, TWI AVF, V4-V6  Recent Labs: No results found for requested labs within last 8760 hours.  Recent Lipid Panel    Component Value Date/Time   CHOL 190 04/05/2007 1102   TRIG 73 04/05/2007 1102   HDL 49.1 04/05/2007 1102   CHOLHDL 3.9 CALC 04/05/2007 1102   VLDL 15 04/05/2007 1102   LDLCALC 126 (H) 04/05/2007 1102    Physical Exam:    VS:  BP 116/84 (BP Location: Left Arm, Patient Position: Sitting, Cuff Size: Normal)   Pulse 67   Temp 97.7 F (36.5 C)   Ht 5\' 5"  (1.651 m)   Wt 118 lb (53.5 kg)   SpO2 97%   BMI 19.64 kg/m     Wt Readings from Last 3 Encounters:  03/27/19 118 lb (53.5 kg)     GEN:  Well nourished, thin Caucasian female well developed in no acute distress HEENT: Normal NECK: No JVD; No carotid bruits LYMPHATICS: No lymphadenopathy CARDIAC: RRR, no murmurs, rubs, gallops RESPIRATORY:  Clear to auscultation without rales, wheezing or rhonchi  ABDOMEN: Soft, non-tender, non-distended MUSCULOSKELETAL:  No edema; No deformity  SKIN: Warm and dry NEUROLOGIC:  Alert and oriented x 3 PSYCHIATRIC:  Normal affect   ASSESSMENT:    Palpitations Pt seen today with complaints of intermittent "hard beats" for the past few weeks- new for her.  Labs, including TSH, drawn yesterday by her PCP- results pending  Abnormal EKG EKG shows PVCs and TWI AVF, V4-V6  Chest tightness Not clearly typical for angina but with FM Hx and abnormal EKG wil r/o CAD with Lexiscan and check echo.   History of lupus (HCC) She has lupus nephritis, followed at [facility].  She tells me her renal function in normal (labs pending).  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  She does not see a rheumatologist.   PLAN:    Discussed with Dr Royann Shiversroitoru in the office today.  Will obtain a Lexiscan Myoview, an echo, and a 7 day ZIO.  F/U after the  above.  I also told her I did not see an indication for aspirin QOD and that she could stop this.    Medication Adjustments/Labs and Tests Ordered: Current medicines are reviewed at length with the patient today.  Concerns regarding medicines are outlined above.  Orders Placed This Encounter  Procedures  . MYOCARDIAL PERFUSION IMAGING  . LONG TERM MONITOR (3-14 DAYS)  . ECHOCARDIOGRAM COMPLETE   No orders of the defined types were placed in this encounter.   Patient Instructions  Medication Instructions:  STOP Aspirin  If you need a refill on your cardiac medications before your next appointment, please call your pharmacy.   Lab work: None  If you have labs (blood work) drawn today and your tests are completely normal, you will receive your results only by: Marland Kitchen. MyChart Message (if you have MyChart) OR . A paper copy in the mail If you have any lab test that is abnormal or we need to change your treatment, we will call you to review the results.  Testing/Procedures: Your physician has requested that you have an echocardiogram. Echocardiography is a painless test that uses sound waves to create images of your heart. It provides your doctor with information about the size and shape of your heart and how well your heart's chambers and valves are working. This procedure takes approximately one hour. There are no restrictions for this procedure. [address]  Your physician has requested that you have a lexiscan myoview. For further information please visit [email or web address]. Please follow instruction sheet, as given. NO MEDICATION TO HOLD  Your physician has recommended that you wear a 7 DAY ZIO-PATCH monitor. The Zio patch cardiac monitor continuously records heart rhythm data for up to 14 days, this is for patients being evaluated for multiple types heart rhythms. For the first 24 hours post application, please avoid getting the Zio monitor wet in the shower or by  excessive sweating during exercise. After that, feel free to carry on with regular activities. Keep soaps and lotions away from the ZIO XT Patch.  [facility] [address], [address].        Follow-Up: At [facility], you and your health needs are our priority.  As part of our continuing mission to provide you with exceptional heart care, we have created designated Provider Care Teams.  These Care Teams include your primary Cardiologist (physician) and Advanced Practice Providers (APPs -  Physician Assistants and Nurse Practitioners) who all work together to provide you with the care you need, when you need it.  . Your physician recommends that you schedule a follow-up appointment in: DR O'NEAL IN 4-6 WEEKS   Any Other Special Instructions Will Be Listed Below (If Applicable).      Jolene ProvostSigned, Jeris Easterly, PA-C  03/27/2019 10:57 AM     [facility]

## 2019-03-31 ENCOUNTER — Ambulatory Visit (INDEPENDENT_AMBULATORY_CARE_PROVIDER_SITE_OTHER): Payer: BC Managed Care – PPO

## 2019-03-31 ENCOUNTER — Other Ambulatory Visit (INDEPENDENT_AMBULATORY_CARE_PROVIDER_SITE_OTHER): Payer: BC Managed Care – PPO

## 2019-03-31 DIAGNOSIS — Z8249 Family history of ischemic heart disease and other diseases of the circulatory system: Secondary | ICD-10-CM

## 2019-03-31 DIAGNOSIS — R079 Chest pain, unspecified: Secondary | ICD-10-CM

## 2019-03-31 DIAGNOSIS — R9431 Abnormal electrocardiogram [ECG] [EKG]: Secondary | ICD-10-CM

## 2019-03-31 DIAGNOSIS — R000 Tachycardia, unspecified: Secondary | ICD-10-CM | POA: Diagnosis not present

## 2019-03-31 DIAGNOSIS — R002 Palpitations: Secondary | ICD-10-CM | POA: Diagnosis not present

## 2019-03-31 DIAGNOSIS — R0789 Other chest pain: Secondary | ICD-10-CM

## 2019-04-01 ENCOUNTER — Telehealth (HOSPITAL_COMMUNITY): Payer: Self-pay | Admitting: *Deleted

## 2019-04-01 NOTE — Telephone Encounter (Signed)
Patient given detailed instructions per Myocardial Perfusion Study Information Sheet for the test on 04/02/2019 at 1045. Patient notified to arrive 15 minutes early and that it is imperative to arrive on time for appointment to keep from having the test rescheduled.  If you need to cancel or reschedule your appointment, please call the office within 24 hours of your appointment. . Patient verbalized understanding.Izael Bessinger, Ranae Palms

## 2019-04-02 ENCOUNTER — Ambulatory Visit (HOSPITAL_COMMUNITY): Payer: BC Managed Care – PPO | Attending: Cardiovascular Disease

## 2019-04-02 ENCOUNTER — Other Ambulatory Visit: Payer: Self-pay

## 2019-04-02 ENCOUNTER — Ambulatory Visit (HOSPITAL_BASED_OUTPATIENT_CLINIC_OR_DEPARTMENT_OTHER): Payer: BC Managed Care – PPO

## 2019-04-02 ENCOUNTER — Encounter (HOSPITAL_COMMUNITY): Payer: Self-pay

## 2019-04-02 VITALS — Ht 65.0 in | Wt 118.0 lb

## 2019-04-02 DIAGNOSIS — R079 Chest pain, unspecified: Secondary | ICD-10-CM

## 2019-04-02 DIAGNOSIS — R000 Tachycardia, unspecified: Secondary | ICD-10-CM

## 2019-04-02 DIAGNOSIS — R002 Palpitations: Secondary | ICD-10-CM | POA: Insufficient documentation

## 2019-04-02 DIAGNOSIS — R9431 Abnormal electrocardiogram [ECG] [EKG]: Secondary | ICD-10-CM

## 2019-04-02 DIAGNOSIS — R0789 Other chest pain: Secondary | ICD-10-CM | POA: Insufficient documentation

## 2019-04-02 DIAGNOSIS — Z8249 Family history of ischemic heart disease and other diseases of the circulatory system: Secondary | ICD-10-CM

## 2019-04-02 LAB — MYOCARDIAL PERFUSION IMAGING
Peak HR: 87 {beats}/min
Rest HR: 60 {beats}/min
SDS: 6
SRS: 0
SSS: 6
TID: 1

## 2019-04-02 LAB — ECHOCARDIOGRAM COMPLETE
Height: 65 in
Weight: 1888 oz

## 2019-04-02 MED ORDER — TECHNETIUM TC 99M TETROFOSMIN IV KIT
10.5000 | PACK | Freq: Once | INTRAVENOUS | Status: AC | PRN
  Administered 2019-04-02: 10.5 via INTRAVENOUS
  Filled 2019-04-02: qty 11

## 2019-04-02 MED ORDER — REGADENOSON 0.4 MG/5ML IV SOLN
0.4000 mg | Freq: Once | INTRAVENOUS | Status: AC
  Administered 2019-04-02: 0.4 mg via INTRAVENOUS

## 2019-04-02 MED ORDER — TECHNETIUM TC 99M TETROFOSMIN IV KIT
31.0000 | PACK | Freq: Once | INTRAVENOUS | Status: AC | PRN
  Administered 2019-04-02: 31 via INTRAVENOUS
  Filled 2019-04-02: qty 31

## 2019-04-03 ENCOUNTER — Telehealth: Payer: Self-pay

## 2019-04-03 NOTE — Telephone Encounter (Addendum)
Left a detailed message for the patient about her ECHO and stress test results and to keep her follow up appointment and to call our iffice if she has any questions about her results.----- Message from Erlene Quan, PA-C sent at 04/02/2019  5:03 PM EDT ----- Please let the patient know her echo and stress test were both normal- very reassuring.  Keep f/u with scheduled  Kerin Ransom PA-C 04/02/2019 5:03 PM

## 2019-04-24 ENCOUNTER — Other Ambulatory Visit: Payer: Self-pay

## 2019-04-24 ENCOUNTER — Ambulatory Visit: Payer: BC Managed Care – PPO | Admitting: Cardiovascular Disease

## 2019-04-24 ENCOUNTER — Encounter: Payer: Self-pay | Admitting: Cardiovascular Disease

## 2019-04-24 VITALS — BP 114/82 | HR 76 | Temp 97.6°F | Ht 65.0 in | Wt 117.0 lb

## 2019-04-24 DIAGNOSIS — I493 Ventricular premature depolarization: Secondary | ICD-10-CM | POA: Diagnosis not present

## 2019-04-24 DIAGNOSIS — R002 Palpitations: Secondary | ICD-10-CM

## 2019-04-24 MED ORDER — METOPROLOL SUCCINATE ER 25 MG PO TB24: 25.0000 mg | ORAL_TABLET | Freq: Every day | ORAL | 3 refills | Status: AC

## 2019-04-24 NOTE — Patient Instructions (Addendum)
Medication Instructions:   - START TAKING METOPROLOL SUCCINATE 25 MG  BY MOUTH  DAILY    If you need a refill on your cardiac medications before your next appointment, please call your pharmacy.   Lab work:  NOT NEEDED   If you have labs (blood work) drawn today and your tests are completely normal, you will receive your results only by: Marland Kitchen. MyChart Message (if you have MyChart) OR . A paper copy in the mail If you have any lab test that is abnormal or we need to change your treatment, we will call you to review the results.  Testing/Procedures: NOT NEEDED   Follow-Up: At [facility], you and your health needs are our priority.  As part of our continuing mission to provide you with exceptional heart care, we have created designated Provider Care Teams.  These Care Teams include your primary Cardiologist (physician) and Advanced Practice Providers (APPs -  Physician Assistants and Nurse Practitioners) who all work together to provide you with the care you need, when you need it. . Dr Flora Lipps'Neal recommends that you schedule a follow-up appointment in 3  MONTHS - DEC 2020      Any Other Special Instructions Will Be Listed Below (If Applicable).     Premature Ventricular Contraction  A premature ventricular contraction (PVC) is a common kind of irregular heartbeat (arrhythmia). These contractions are extra heartbeats that start in the ventricles of the heart and occur too early in the normal sequence. During the PVC, the heart's normal electrical pathway is not used, so the beat is shorter and less effective. In most cases, these contractions come and go and do not require treatment. What are the causes? Common causes of the condition include:  Smoking.  Drinking alcohol.  Certain medicines.  Some illegal drugs.  Stress.  Caffeine. Certain medical conditions can also cause PVCs:  Heart failure.  Heart attack, or coronary artery disease.  Heart valve problems.   Changes in minerals in the blood (electrolytes).  Low blood oxygen levels or high carbon dioxide levels. In many cases, the cause of this condition is not known. What are the signs or symptoms? The main symptom of this condition is fast or skipped heartbeats (palpitations). Other symptoms include:  Chest pain.  Shortness of breath.  Feeling tired.  Dizziness.  Difficulty exercising. In some cases, there are no symptoms. How is this diagnosed? This condition may be diagnosed based on:  Your medical history.  A physical exam. During the exam, the health care provider will check for irregular heartbeats.  Tests, such as: ? An ECG (electrocardiogram) to monitor the electrical activity of your heart. ? An ambulatory cardiac monitor. This device records your heartbeats for 24 hours or more. ? Stress tests to see how exercise affects your heart rhythm and blood supply. ? An echocardiogram. This test uses sound waves (ultrasound) to produce an image of your heart. ? An electrophysiology study (EPS). This test checks for electrical problems in your heart. How is this treated? Treatment for this condition depends on any underlying conditions, the type of PVCs that you are having, and how much the symptoms are interfering with your daily life. Possible treatments include:  Avoiding things that cause premature contractions (triggers). These include caffeine and alcohol.  Taking medicines if symptoms are severe or if the extra heartbeats are frequent.  Getting treatment for underlying conditions that cause PVCs.  Having an implantable cardioverter defibrillator (ICD), if you are at risk for a serious arrhythmia.  The ICD is a small device that is inserted into your chest to monitor your heartbeat. When it senses an irregular heartbeat, it sends a shock to bring the heartbeat back to normal.  Having a procedure to destroy the portion of the heart tissue that sends out abnormal signals  (catheter ablation). In some cases, no treatment is required. Follow these instructions at home: Lifestyle  Do not use any products that contain nicotine or tobacco, such as cigarettes, e-cigarettes, and chewing tobacco. If you need help quitting, ask your health care provider.  Do not use illegal drugs.  Exercise regularly. Ask your health care provider what type of exercise is safe for you.  Try to get at least 7-9 hours of sleep each night, or as much as recommended by your health care provider.  Find healthy ways to manage stress. Avoid stressful situations when possible. Alcohol use  Do not drink alcohol if: ? Your health care provider tells you not to drink. ? You are pregnant, may be pregnant, or are planning to become pregnant. ? Alcohol triggers your episodes.  If you drink alcohol: ? Limit how much you use to:  0-1 drink a day for women.  0-2 drinks a day for men.  Be aware of how much alcohol is in your drink. In the [place] one drink equals one 12 oz bottle of beer (355 mL), one 5 oz glass of wine (148 mL), or one 1 oz glass of hard liquor (44 mL). General instructions  Take over-the-counter and prescription medicines only as told by your health care provider.  If caffeine triggers episodes of PVC, do not eat, drink, or use anything with caffeine in it.  Keep all follow-up visits as told by your health care provider. This is important. Contact a health care provider if you:  Feel palpitations. Get help right away if you:  Have chest pain.  Have shortness of breath.  Have sweating for no reason.  Have nausea and vomiting.  Become light-headed or you faint. Summary  A premature ventricular contraction (PVC) is a common kind of irregular heartbeat (arrhythmia).  In most cases, these contractions come and go and do not require treatment.  You may need to wear an ambulatory cardiac monitor. This records your heartbeats for 24 hours or more.  Treatment  depends on any underlying conditions, the type of PVCs that you are having, and how much the symptoms are interfering with your daily life. This information is not intended to replace advice given to you by your health care provider. Make sure you discuss any questions you have with your health care provider. Document Released: 03/17/2004 Document Revised: 04/25/2018 Document Reviewed: 04/25/2018 Elsevier Patient Education  2020 [employer].

## 2019-04-24 NOTE — Progress Notes (Signed)
Cardiology Office Note:   Date:  04/24/2019  NAME:  Dorothy Bray    MRN: [id number] DOB:  1961/01/12   PCP:  Iona HansenJones, Penny L, NP  Cardiologist:  Reatha HarpsWesley T O'Neal, MD   Referring MD: Iona HansenJones, Penny L, NP   Chief Complaint  Patient presents with   Palpitations   History of Present Illness:   Dorothy Bray is a 58 y.o. female with a hx of lupus nephritis who presents for follow-up of palpitations.  She was recently seen in our clinic for evaluation of palpitations and chest tightness.  She underwent an echocardiogram which was normal.  She had a normal nuclear medicine stress test.  She did have a 7-day ambulatory ECG monitoring.  It did reveal frequent PVCs, nearly 9.3% of beats were PVCs.  She had brief SVT, of 6 beats, really not concerning.  She also did have brief ventricular bigeminy as well as trigeminy.  She reports since her visit her symptoms are still occurring and happen nearly 1 time per day.  She reports they are happening less frequently.  She reports restarting CBD oil and this may help.  She reports her thyroid studies were normal at her primary care office.  She is recently been evaluated by her nephrologist who is pleased with her overall kidney function.  There appears to be no concerns for any flare of lupus nephritis.  She is a yoga structure and quite healthy.  She maintains a very normal body weight and blood pressure is normal today.  She does have concerns about starting metoprolol as this medicine may lower her blood pressure.  But she is willing to try.  She endorses no chest pain, shortness of breath, palpitations, chest pressure today in office.  I personally reviewed her echocardiogram, nuclear medicine scan, and actually read her ambulatory ECG monitor report.  Past Medical History: Past Medical History:  Diagnosis Date   Hyperlipidemia    Lupus erythematosus    Lupus nephritis (HCC)    Followed at [facility]   Nephrolithiasis    Osteopenia     Past  Surgical History: Past Surgical History:  Procedure Laterality Date   CESAREAN SECTION     KNEE SURGERY     LITHOTRIPSY      Current Medications: Current Meds  Medication Sig   Calcium-Vitamin D 500-125 MG-UNIT TABS Take 2 tablets by mouth daily.    DHEA 25 MG CAPS Take by mouth.   diphenhydrAMINE (BENADRYL) 25 MG tablet Take 25 mg by mouth daily.    Estradiol 10 MCG TABS vaginal tablet Take by mouth. vaginally   hydroxychloroquine (PLAQUENIL) 200 MG tablet Take 200 mg by mouth 2 (two) times daily.    Magnesium 400 MG CAPS Take 1 capsule by mouth daily.    Multiple Vitamins-Minerals (VITAMIN D3 COMPLETE PO) Take 100 mcg by mouth.   NON FORMULARY cbd oil takes a few drops a day   triamcinolone (KENALOG) 0.1 % paste Use as directed     Allergies:    Benzodiazepines, Ciprofloxacin, Haloperidol lactate, Lisinopril, Alprazolam er, Fluticasone-salmeterol, Prednisone, and Sulfa antibiotics   Social History: Social History   Socioeconomic History   Marital status: Divorced    Spouse name: Not on file   Number of children: 0   Years of education: Not on file   Highest education level: Not on file  Occupational History   Not on file  Social Needs   Financial resource strain: Not on file   Food insecurity  Worry: Not on file    Inability: Not on file   Transportation needs    Medical: Not on file    Non-medical: Not on file  Tobacco Use   Smoking status: Never Smoker   Smokeless tobacco: Never Used  Substance and Sexual Activity   Alcohol use: Not Currently   Drug use: Never   Sexual activity: Yes  Lifestyle   Physical activity    Days per week: Not on file    Minutes per session: Not on file   Stress: Not on file  Relationships   Social connections    Talks on phone: Not on file    Gets together: Not on file    Attends religious service: Not on file    Active member of club or organization: Not on file    Attends meetings of clubs or  organizations: Not on file    Relationship status: Not on file  Other Topics Concern   Not on file  Social History Narrative   Not on file     Family History: The patient's family history includes Anemia in her mother; Cancer in her brother; Dementia in her father; Heart disease in her sister; Hyperlipidemia in her mother; Parkinsonism in her father.  ROS:   All other ROS reviewed and negative. Pertinent positives noted in the HPI.     EKGs/Labs/Other Studies Reviewed:   The following studies were personally reviewed by me today:  Zio 7 day (04/14/2019): Overall, frequent PVCs noted that comprised 9.3% of total beats. She had brief run ~6 beats of SVT. No symptom diary reported. No sustained arrhythmias noted or atrial fibrillation.   Echo (04/02/2019) 1. The left ventricle has normal systolic function with an ejection fraction of 60-65%. The cavity size was normal. Left ventricular diastolic parameters were normal. No evidence of left ventricular regional wall motion abnormalities.  2. The right ventricle has normal systolic function. The cavity was normal. There is no increase in right ventricular wall thickness. Right ventricular systolic pressure TR jet inadequate for RVSP assessment.  3. Mild thickening of the mitral valve leaflet.  4. The aortic valve is tricuspid. No stenosis of the aortic valve.  5. The aorta is normal unless otherwise noted.  6. Normal transthoracic study. No comparison. Frequent PVCs noted.  NM Stress (04/02/2019)  There was no ST segment deviation noted during stress.  This is a low risk study. There are no perfusion defects, no ischemia or infarct.  Study is non-gated, no EF calculated. PVCs noted.  The study is normal.  Recent Labs: No results found for requested labs within last 8760 hours.   Recent Lipid Panel    Component Value Date/Time   CHOL 190 04/05/2007 1102   TRIG 73 04/05/2007 1102   HDL 49.1 04/05/2007 1102   CHOLHDL 3.9 CALC  04/05/2007 1102   VLDL 15 04/05/2007 1102   LDLCALC 126 (H) 04/05/2007 1102    Physical Exam:   VS:  BP 114/82 (BP Location: Right Arm, Patient Position: Sitting, Cuff Size: Normal)    Pulse 76    Temp 97.6 F (36.4 C) (Temporal)    Ht 5\' 5"  (1.651 m)    Wt 117 lb (53.1 kg)    SpO2 96%    BMI 19.47 kg/m    Wt Readings from Last 3 Encounters:  04/24/19 117 lb (53.1 kg)  04/02/19 118 lb (53.5 kg)  03/27/19 118 lb (53.5 kg)    General: Well nourished, well developed, in no acute  distress Heart: Atraumatic, normal size  Eyes: PEERLA, EOMI  Neck: Supple, no JVD Endocrine: No thryomegaly Cardiac: Normal S1, S2; RRR; no murmurs, rubs, or gallops Lungs: Clear to auscultation bilaterally, no wheezing, rhonchi or rales  Abd: Soft, nontender, no hepatomegaly  Ext: No edema, pulses 2+ Musculoskeletal: No deformities, BUE and BLE strength normal and equal Skin: Warm and dry, no rashes   Neuro: Alert and oriented to person, place, time, and situation, CNII-XII grossly intact, no focal deficits  Psych: Normal mood and affect   ASSESSMENT:   NAME@ is a 58 y.o. female who presents for the following: 1. PVC's (premature ventricular contractions)   2. Palpitations     PLAN:   1. PVC's (premature ventricular contractions) 2. Palpitations -She had a recent normal echocardiogram as well as nuclear medicine stress imaging study.  Her ambulatory ECG report shows frequent PVCs, up to 9.3% of beats.  There was no episodes of sustained arrhythmias, or atrial fibrillation.  It appears her symptoms are all related to symptomatic PVCs.  They have become less frequent and symptoms per her report.  I discussed with her that it is reasonable to start metoprolol succinate 25 mg daily.  She does express concerns about tolerating this medication as her blood pressure is 114/82 today.  I reported to her that we can attempt this medication to see if she tolerates it to see if the beats become less frequent.  We also  have the opportunity to just watch if she is unable to tolerate this medication.  She is up to giving this a try, and I will see her back in 3 months to further assess her symptoms and ability to tolerate metoprolol.  We could also consider EP referral in the future if we are unable to control her symptoms/PVCs with medication alone.  Disposition: Return in about 3 months (around 07/24/2019).  Medication Adjustments/Labs and Tests Ordered: Current medicines are reviewed at length with the patient today.  Concerns regarding medicines are outlined above.  No orders of the defined types were placed in this encounter.  Meds ordered this encounter  Medications   metoprolol succinate (TOPROL XL) 25 MG 24 hr tablet    Sig: Take 1 tablet (25 mg total) by mouth daily.    Dispense:  90 tablet    Refill:  3    Patient Instructions  Medication Instructions:   - START TAKING METOPROLOL SUCCINATE 25 MG  BY MOUTH  DAILY    If you need a refill on your cardiac medications before your next appointment, please call your pharmacy.   Lab work:  NOT NEEDED   If you have labs (blood work) drawn today and your tests are completely normal, you will receive your results only by:  MyChart Message (if you have MyChart) OR  A paper copy in the mail If you have any lab test that is abnormal or we need to change your treatment, we will call you to review the results.  Testing/Procedures: NOT NEEDED   Follow-Up: At [facility], you and your health needs are our priority.  As part of our continuing mission to provide you with exceptional heart care, we have created designated Provider Care Teams.  These Care Teams include your primary Cardiologist (physician) and Advanced Practice Providers (APPs -  Physician Assistants and Nurse Practitioners) who all work together to provide you with the care you need, when you need it.  Dr Flora Lipps recommends that you schedule a follow-up appointment in 3  MONTHS -  DEC 2020      Any Other Special Instructions Will Be Listed Below (If Applicable).     Premature Ventricular Contraction  A premature ventricular contraction (PVC) is a common kind of irregular heartbeat (arrhythmia). These contractions are extra heartbeats that start in the ventricles of the heart and occur too early in the normal sequence. During the PVC, the heart's normal electrical pathway is not used, so the beat is shorter and less effective. In most cases, these contractions come and go and do not require treatment. What are the causes? Common causes of the condition include:  Smoking.  Drinking alcohol.  Certain medicines.  Some illegal drugs.  Stress.  Caffeine. Certain medical conditions can also cause PVCs:  Heart failure.  Heart attack, or coronary artery disease.  Heart valve problems.  Changes in minerals in the blood (electrolytes).  Low blood oxygen levels or high carbon dioxide levels. In many cases, the cause of this condition is not known. What are the signs or symptoms? The main symptom of this condition is fast or skipped heartbeats (palpitations). Other symptoms include:  Chest pain.  Shortness of breath.  Feeling tired.  Dizziness.  Difficulty exercising. In some cases, there are no symptoms. How is this diagnosed? This condition may be diagnosed based on:  Your medical history.  A physical exam. During the exam, the health care provider will check for irregular heartbeats.  Tests, such as: ? An ECG (electrocardiogram) to monitor the electrical activity of your heart. ? An ambulatory cardiac monitor. This device records your heartbeats for 24 hours or more. ? Stress tests to see how exercise affects your heart rhythm and blood supply. ? An echocardiogram. This test uses sound waves (ultrasound) to produce an image of your heart. ? An electrophysiology study (EPS). This test checks for electrical problems in your heart. How is  this treated? Treatment for this condition depends on any underlying conditions, the type of PVCs that you are having, and how much the symptoms are interfering with your daily life. Possible treatments include:  Avoiding things that cause premature contractions (triggers). These include caffeine and alcohol.  Taking medicines if symptoms are severe or if the extra heartbeats are frequent.  Getting treatment for underlying conditions that cause PVCs.  Having an implantable cardioverter defibrillator (ICD), if you are at risk for a serious arrhythmia. The ICD is a small device that is inserted into your chest to monitor your heartbeat. When it senses an irregular heartbeat, it sends a shock to bring the heartbeat back to normal.  Having a procedure to destroy the portion of the heart tissue that sends out abnormal signals (catheter ablation). In some cases, no treatment is required. Follow these instructions at home: Lifestyle  Do not use any products that contain nicotine or tobacco, such as cigarettes, e-cigarettes, and chewing tobacco. If you need help quitting, ask your health care provider.  Do not use illegal drugs.  Exercise regularly. Ask your health care provider what type of exercise is safe for you.  Try to get at least 7-9 hours of sleep each night, or as much as recommended by your health care provider.  Find healthy ways to manage stress. Avoid stressful situations when possible. Alcohol use  Do not drink alcohol if: ? Your health care provider tells you not to drink. ? You are pregnant, may be pregnant, or are planning to become pregnant. ? Alcohol triggers your episodes.  If you drink alcohol: ? Limit how  much you use to:  0-1 drink a day for women.  0-2 drinks a day for men.  Be aware of how much alcohol is in your drink. In the [place] one drink equals one 12 oz bottle of beer (355 mL), one 5 oz glass of wine (148 mL), or one 1 oz glass of hard liquor (44  mL). General instructions  Take over-the-counter and prescription medicines only as told by your health care provider.  If caffeine triggers episodes of PVC, do not eat, drink, or use anything with caffeine in it.  Keep all follow-up visits as told by your health care provider. This is important. Contact a health care provider if you:  Feel palpitations. Get help right away if you:  Have chest pain.  Have shortness of breath.  Have sweating for no reason.  Have nausea and vomiting.  Become light-headed or you faint. Summary  A premature ventricular contraction (PVC) is a common kind of irregular heartbeat (arrhythmia).  In most cases, these contractions come and go and do not require treatment.  You may need to wear an ambulatory cardiac monitor. This records your heartbeats for 24 hours or more.  Treatment depends on any underlying conditions, the type of PVCs that you are having, and how much the symptoms are interfering with your daily life. This information is not intended to replace advice given to you by your health care provider. Make sure you discuss any questions you have with your health care provider. Document Released: 03/17/2004 Document Revised: 04/25/2018 Document Reviewed: 04/25/2018 Elsevier Patient Education  2020 [employer]        Signed, Hem[place] FarmsWesley T. Flora Lipps'Neal, MD [facility]   [facility]  [address], [address] [place], [place] [address] [phone]  04/24/2019 10:17 AM

## 2019-07-09 ENCOUNTER — Telehealth: Payer: Self-pay | Admitting: Cardiovascular Disease

## 2019-07-09 NOTE — Telephone Encounter (Signed)
Called pt and left message informing pt that I called her pharmacy and took care of the issue with her medication metoprolol. Pt's pharmacy is getting pt's medication ready for pt to pick up. I advised pt that if she has any other problems, questions or concerns, to call the office back.

## 2019-07-28 ENCOUNTER — Ambulatory Visit: Payer: BC Managed Care – PPO | Admitting: Cardiovascular Disease

## 2020-05-03 ENCOUNTER — Other Ambulatory Visit: Payer: Self-pay | Admitting: Oncology

## 2020-05-03 ENCOUNTER — Ambulatory Visit (HOSPITAL_COMMUNITY)
Admission: RE | Admit: 2020-05-03 | Discharge: 2020-05-03 | Disposition: A | Discharge status: Home / Self Care | Payer: BC Managed Care – PPO | Source: Ambulatory Visit | Attending: Pulmonary Disease | Admitting: Pulmonary Disease

## 2020-05-03 DIAGNOSIS — U071 COVID-19: Secondary | ICD-10-CM | POA: Insufficient documentation

## 2020-05-03 MED ORDER — DIPHENHYDRAMINE HCL 50 MG/ML IJ SOLN: 50.0000 mg | Freq: Once | INTRAMUSCULAR | Status: DC | PRN

## 2020-05-03 MED ORDER — ALBUTEROL SULFATE HFA 108 (90 BASE) MCG/ACT IN AERS: 2.0000 | INHALATION_SPRAY | Freq: Once | RESPIRATORY_TRACT | Status: DC | PRN

## 2020-05-03 MED ORDER — METHYLPREDNISOLONE SODIUM SUCC 125 MG IJ SOLR: 125.0000 mg | Freq: Once | INTRAMUSCULAR | Status: DC | PRN

## 2020-05-03 MED ORDER — SODIUM CHLORIDE 0.9 % IV SOLN: INTRAVENOUS | Status: DC | PRN

## 2020-05-03 MED ORDER — EPINEPHRINE 0.3 MG/0.3ML IJ SOAJ: 0.3000 mg | Freq: Once | INTRAMUSCULAR | Status: DC | PRN

## 2020-05-03 MED ORDER — CASIRIVIMAB-IMDEVIMAB 600-600 MG/10ML IJ SOLN
1200.0000 mg | Freq: Once | INTRAMUSCULAR | Status: AC
  Administered 2020-05-03: 1200 mg via INTRAVENOUS

## 2020-05-03 MED ORDER — FAMOTIDINE IN NACL 20-0.9 MG/50ML-% IV SOLN: 20.0000 mg | Freq: Once | INTRAVENOUS | Status: DC | PRN

## 2020-05-03 NOTE — Progress Notes (Signed)
I connected by phone with  Mrs. Aliberti to discuss the potential use of an new treatment for mild to moderate COVID-19 viral infection in non-hospitalized patients.   This patient is a age/sex that meets the FDA criteria for Emergency Use Authorization of casirivimab\imdevimab.  Has a (+) direct SARS-CoV-2 viral test result 1. Has mild or moderate COVID-19  2. Is ? 59 years of age and weighs ? 40 kg 3. Is NOT hospitalized due to COVID-19 4. Is NOT requiring oxygen therapy or requiring an increase in baseline oxygen flow rate due to COVID-19 5. Is within 10 days of symptom onset 6. Has at least one of the high risk factor(s) for progression to severe COVID-19 and/or hospitalization as defined in EUA. Specific high risk criteria : Past Medical History:  Diagnosis Date  . Hyperlipidemia   . Lupus erythematosus   . Lupus nephritis (HCC)    Followed at [facility]  . Nephrolithiasis   . Osteopenia   ?  ?    Symptom onset  04/30/2020   I have spoken and communicated the following to the patient or parent/caregiver:   1. FDA has authorized the emergency use of casirivimab\imdevimab for the treatment of mild to moderate COVID-19 in adults and pediatric patients with positive results of direct SARS-CoV-2 viral testing who are 25 years of age and older weighing at least 40 kg, and who are at high risk for progressing to severe COVID-19 and/or hospitalization.   2. The significant known and potential risks and benefits of casirivimab\imdevimab, and the extent to which such potential risks and benefits are unknown.   3. Information on available alternative treatments and the risks and benefits of those alternatives, including clinical trials.   4. Patients treated with casirivimab\imdevimab should continue to self-isolate and use infection control measures (e.g., wear mask, isolate, social distance, avoid sharing personal items, clean and disinfect "high touch" surfaces, and frequent  handwashing) according to CDC guidelines.    5. The patient or parent/caregiver has the option to accept or refuse casirivimab\imdevimab .   After reviewing this information with the patient, The patient agreed to proceed with receiving casirivimab\imdevimab infusion and will be provided a copy of the Fact sheet prior to receiving the infusion.Mignon Pine, AGNP-C [phone] (Infusion Center Hotline)

## 2020-05-03 NOTE — Discharge Instructions (Signed)

## 2020-05-03 NOTE — Progress Notes (Signed)
  Diagnosis: COVID-19  Physician: Dr. Wright  Procedure: [facility] Infusion Clinic Med: casirivimab\imdevimab infusion - Provided patient with casirivimab\imdevimab fact sheet for patients, parents and caregivers prior to infusion.  Complications: No immediate complications noted.  Discharge: Discharged home   Dorothy Bray M Janean Eischen 05/03/2020
